# Patient Record
Sex: Male | Born: 1950 | Race: White | Hispanic: No | Marital: Married | Smoking: Former smoker
Health system: Southern US, Community
[De-identification: ages and names within clinical notes are randomized; demographics above are authoritative.]

## PROBLEM LIST (undated history)

## (undated) DIAGNOSIS — H409 Unspecified glaucoma: Secondary | ICD-10-CM

## (undated) DIAGNOSIS — Z86010 Personal history of colonic polyps: Secondary | ICD-10-CM

## (undated) DIAGNOSIS — M1990 Unspecified osteoarthritis, unspecified site: Secondary | ICD-10-CM

## (undated) DIAGNOSIS — H2513 Age-related nuclear cataract, bilateral: Secondary | ICD-10-CM

## (undated) DIAGNOSIS — E669 Obesity, unspecified: Secondary | ICD-10-CM

## (undated) DIAGNOSIS — Z860101 Personal history of adenomatous and serrated colon polyps: Secondary | ICD-10-CM

## (undated) DIAGNOSIS — I10 Essential (primary) hypertension: Secondary | ICD-10-CM

## (undated) DIAGNOSIS — H9190 Unspecified hearing loss, unspecified ear: Secondary | ICD-10-CM

## (undated) DIAGNOSIS — N200 Calculus of kidney: Secondary | ICD-10-CM

## (undated) DIAGNOSIS — IMO0001 Reserved for inherently not codable concepts without codable children: Secondary | ICD-10-CM

## (undated) DIAGNOSIS — E7800 Pure hypercholesterolemia, unspecified: Secondary | ICD-10-CM

## (undated) HISTORY — DX: Calculus of kidney: N20.0

## (undated) HISTORY — DX: Personal history of adenomatous and serrated colon polyps: Z86.0101

## (undated) HISTORY — DX: Unspecified glaucoma: H40.9

## (undated) HISTORY — DX: Obesity, unspecified: E66.9

## (undated) HISTORY — DX: Personal history of colonic polyps: Z86.010

## (undated) HISTORY — DX: Unspecified osteoarthritis, unspecified site: M19.90

## (undated) HISTORY — PX: COLONOSCOPY: SHX174

## (undated) HISTORY — DX: Age-related nuclear cataract, bilateral: H25.13

## (undated) HISTORY — DX: Essential (primary) hypertension: I10

## (undated) HISTORY — DX: Unspecified hearing loss, unspecified ear: H91.90

## (undated) HISTORY — DX: Pure hypercholesterolemia, unspecified: E78.00

## (undated) HISTORY — DX: Reserved for inherently not codable concepts without codable children: IMO0001

---

## 1997-11-15 DIAGNOSIS — I10 Essential (primary) hypertension: Secondary | ICD-10-CM

## 1997-11-15 DIAGNOSIS — E7800 Pure hypercholesterolemia, unspecified: Secondary | ICD-10-CM

## 1997-11-15 HISTORY — DX: Pure hypercholesterolemia, unspecified: E78.00

## 1997-11-15 HISTORY — DX: Essential (primary) hypertension: I10

## 2009-10-25 ENCOUNTER — Ambulatory Visit: Payer: Self-pay | Admitting: Diagnostic Radiology

## 2009-10-25 ENCOUNTER — Emergency Department (HOSPITAL_BASED_OUTPATIENT_CLINIC_OR_DEPARTMENT_OTHER): Admission: EM | Admit: 2009-10-25 | Discharge: 2009-10-25 | Payer: Self-pay | Admitting: Emergency Medicine

## 2010-08-04 IMAGING — CR DG CERVICAL SPINE COMPLETE 4+V
6 series · 6 of 6 positions shown · non-contrast
Comparison: None.

CLINICAL DATA: 58-year-old male with neck pain and stiffness.  No
known injury.

CERVICAL SPINE - COMPLETE 4+ VIEW

[w c-spine lat]
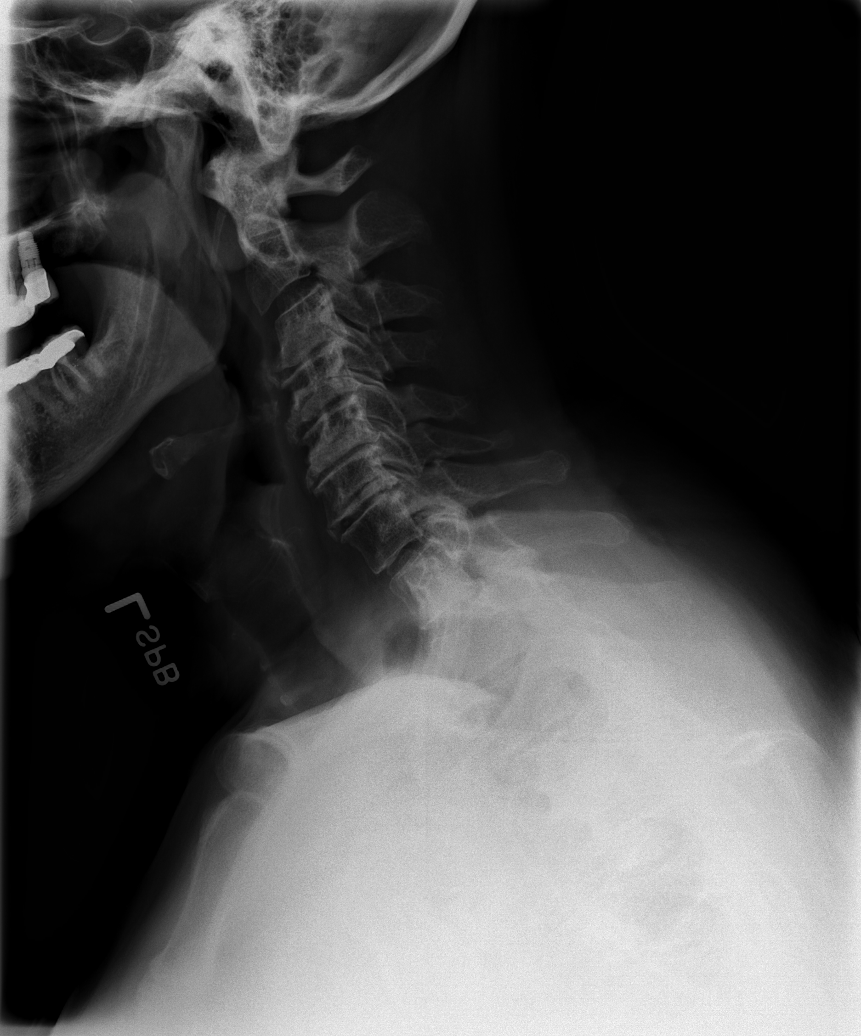

[w c-spine oblique (1 of 2)]
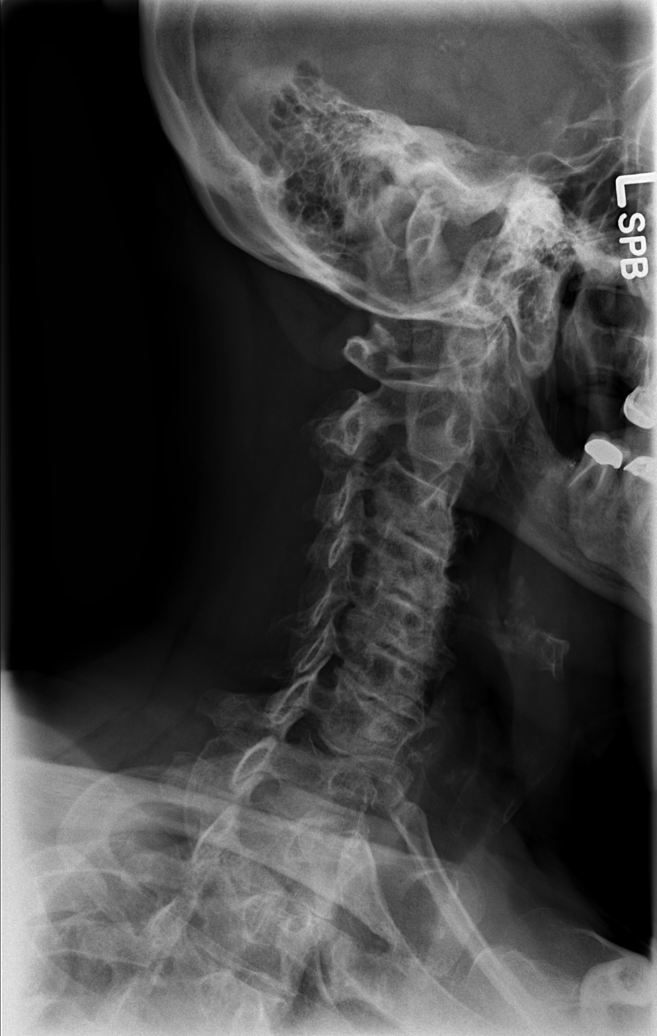

[w c-spine oblique (2 of 2)]
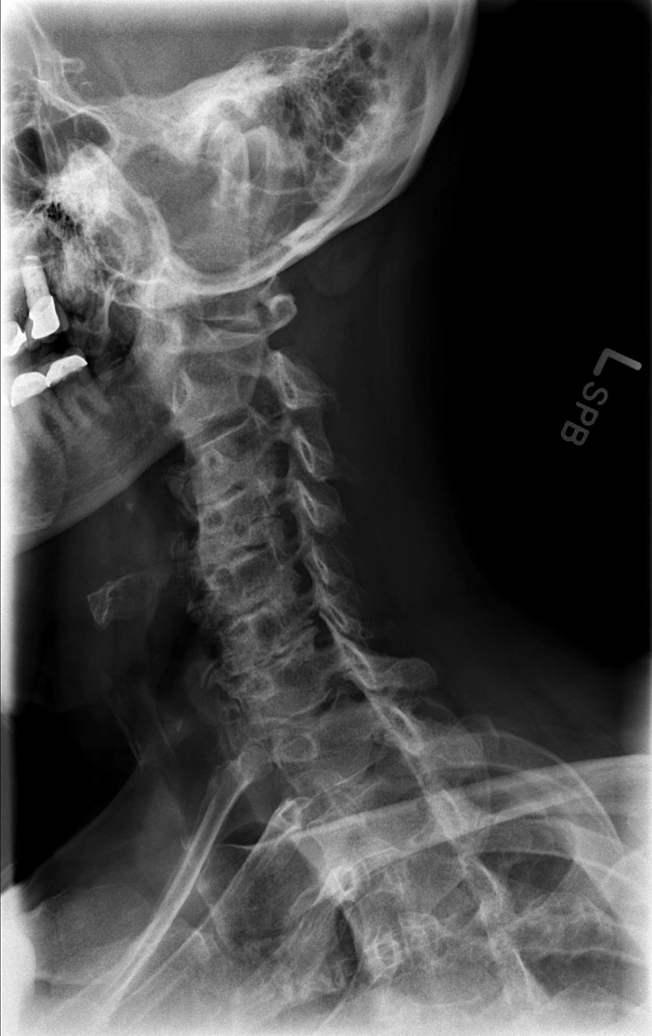

[w c-spine a.p.]
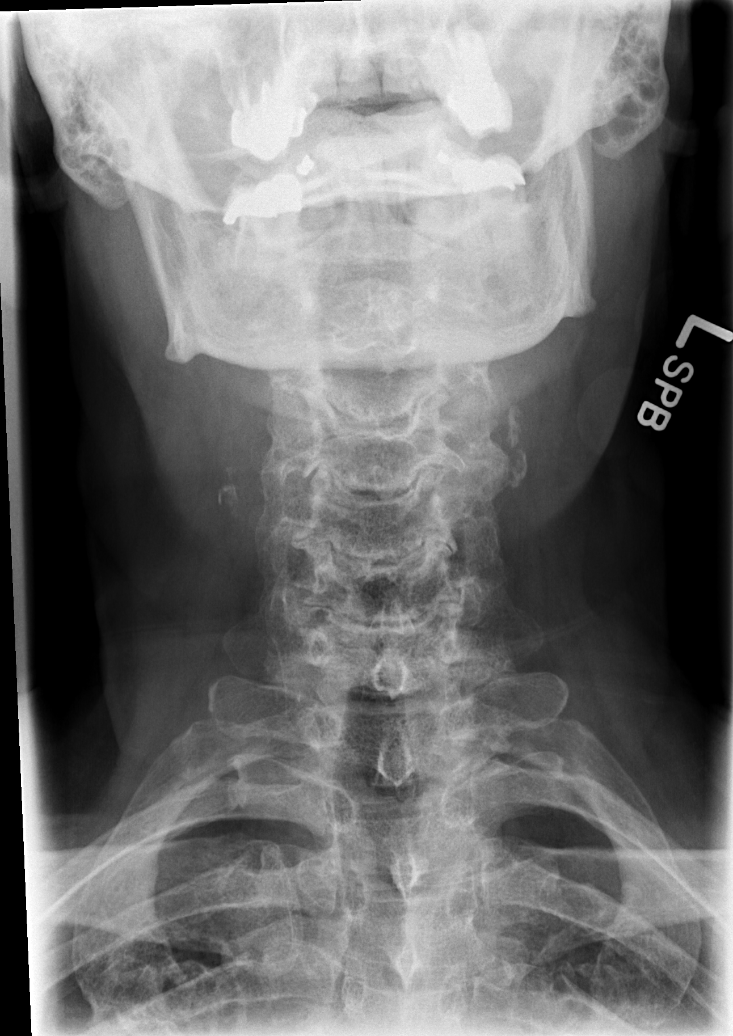

[w c-spine odontoid (1 of 2)]
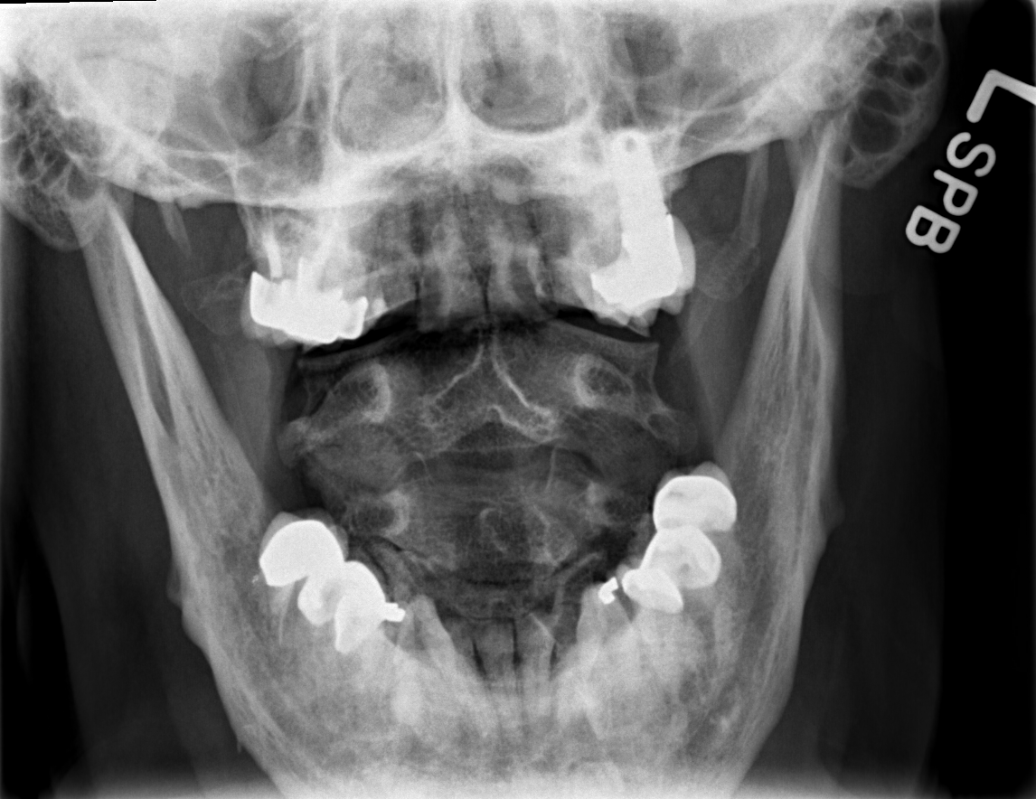

[w c-spine odontoid (2 of 2)]
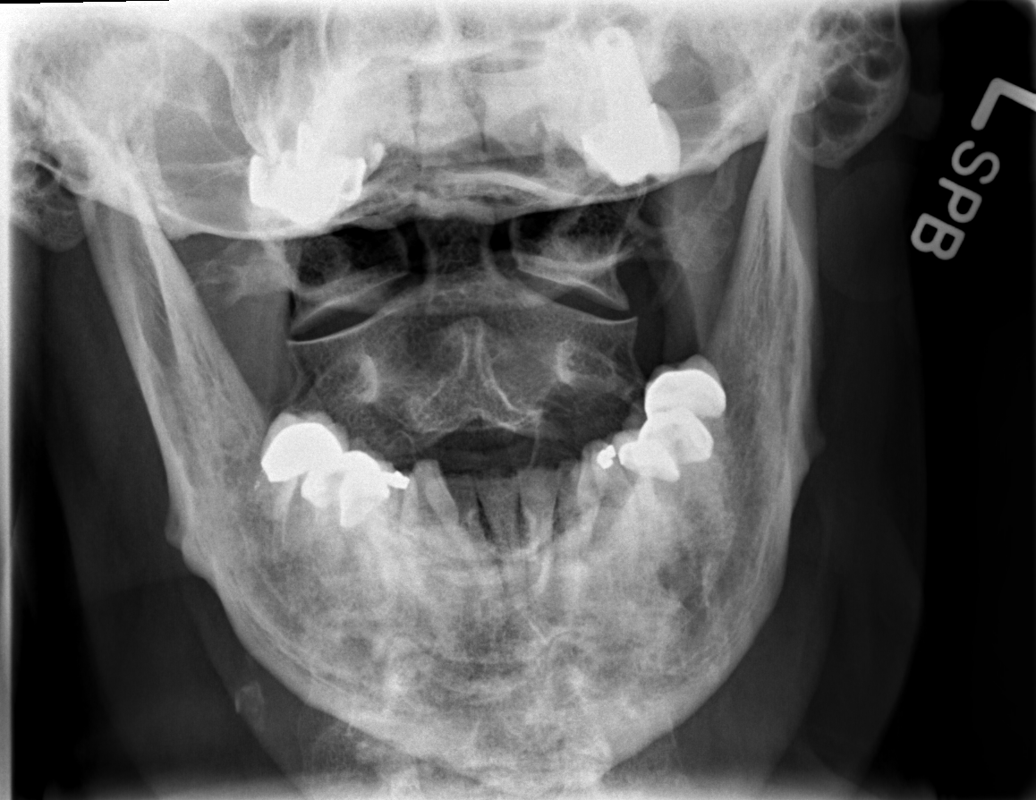

[6 of 6 positions shown; findings below may reference images not displayed]

FINDINGS: Mild reversal of cervical lordosis.  Trace
anterolisthesis of C7 on T1, and 2-3 mm of retrolisthesis of C3 on
C4.  Diffuse chronic severe cervical disc degeneration at C3-C4
through C6-C7, with endplate osteophytes and sclerosis. Bilateral
posterior element alignment is within normal limits.  Prevertebral
soft tissues are within normal limits.  Bilateral carotid calcified
atherosclerosis.  AP alignment within normal limits.  C1-C2
alignment and odontoid process within normal limits.
IMPRESSION: 1.  Chronic severe cervical disc degeneration C3-C4 through C6-C7.
2.  Spondylolisthesis at C3-C4 and to a lesser extent C7-T1.  This
is favored to be chronic and degenerative.

## 2011-04-19 ENCOUNTER — Encounter: Payer: Self-pay | Admitting: Family Medicine

## 2011-04-21 ENCOUNTER — Encounter: Payer: Self-pay | Admitting: Family Medicine

## 2011-04-21 ENCOUNTER — Ambulatory Visit (INDEPENDENT_AMBULATORY_CARE_PROVIDER_SITE_OTHER): Payer: 59 | Admitting: Family Medicine

## 2011-04-21 DIAGNOSIS — E785 Hyperlipidemia, unspecified: Secondary | ICD-10-CM | POA: Insufficient documentation

## 2011-04-21 DIAGNOSIS — Z23 Encounter for immunization: Secondary | ICD-10-CM

## 2011-04-21 DIAGNOSIS — Z125 Encounter for screening for malignant neoplasm of prostate: Secondary | ICD-10-CM

## 2011-04-21 DIAGNOSIS — E119 Type 2 diabetes mellitus without complications: Secondary | ICD-10-CM

## 2011-04-21 DIAGNOSIS — I10 Essential (primary) hypertension: Secondary | ICD-10-CM

## 2011-04-21 DIAGNOSIS — R3129 Other microscopic hematuria: Secondary | ICD-10-CM

## 2011-04-21 LAB — HEMOGLOBIN A1C: Hgb A1c MFr Bld: 6.6 % — ABNORMAL HIGH (ref 4.6–6.5)

## 2011-04-21 LAB — TSH: TSH: 0.69 u[IU]/mL (ref 0.35–5.50)

## 2011-04-21 LAB — MICROALBUMIN / CREATININE URINE RATIO
Creatinine,U: 67.3 mg/dL
Microalb Creat Ratio: 0.7 mg/g (ref 0.0–30.0)
Microalb, Ur: 0.5 mg/dL (ref 0.0–1.9)

## 2011-04-21 LAB — LIPID PANEL
Cholesterol: 111 mg/dL (ref 0–200)
HDL: 51.2 mg/dL (ref >39.00)
Triglycerides: 30 mg/dL (ref 0.0–149.0)
VLDL: 6 mg/dL (ref 0.0–40.0)

## 2011-04-21 LAB — CBC WITH DIFFERENTIAL/PLATELET
Basophils Absolute: 0 10*3/uL (ref 0.0–0.1)
Eosinophils Absolute: 0.4 10*3/uL (ref 0.0–0.7)
Lymphocytes Relative: 27.2 % (ref 12.0–46.0)
MCHC: 34 g/dL (ref 30.0–36.0)
Monocytes Relative: 10.2 % (ref 3.0–12.0)
Neutro Abs: 4.3 10*3/uL (ref 1.4–7.7)
Neutrophils Relative %: 56.7 % (ref 43.0–77.0)
Platelets: 250 10*3/uL (ref 150.0–400.0)
RDW: 14 % (ref 11.5–14.6)

## 2011-04-21 LAB — COMPREHENSIVE METABOLIC PANEL
BUN: 13 mg/dL (ref 6–23)
CO2: 28 mEq/L (ref 19–32)
GFR: 127.61 mL/min (ref >60.00)
Glucose, Bld: 117 mg/dL — ABNORMAL HIGH (ref 70–99)
Sodium: 139 mEq/L (ref 135–145)
Total Bilirubin: 0.4 mg/dL (ref 0.3–1.2)
Total Protein: 7.6 g/dL (ref 6.0–8.3)

## 2011-04-21 LAB — POCT URINALYSIS DIPSTICK
Bilirubin, UA: NEGATIVE
Glucose, UA: NEGATIVE
Nitrite, UA: NEGATIVE
Urobilinogen, UA: 0.2

## 2011-04-21 NOTE — Assessment & Plan Note (Signed)
FLP ordered today. Continue statin and low chol diet.

## 2011-04-21 NOTE — Progress Notes (Addendum)
Office Note 04/21/2011  CC:  Chief Complaint  Patient presents with  . Establish Care    new patient    HPI:  Thomas Boyle is a 60 y.o. White male who is here to establish care. Patient's most recent primary MD: Dr. Dagoberto Ligas (retired this year). Old records were reviewed prior to or during today's visit.  Has had well controlled DM 2 w/oral meds and diet treatment since 1998.  No diabetic nephropathy, neuropathy, or retinopathy. Last HbA1c was 6.3% on 08/13/10, at which time his lipids were at goal as well.   He's due for urine microalbumin testing (last 01/2010). Has also had well controlled HTN, compliant with meds and low Na diet. Does not exercise but is NOT sedentary.  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  Says he has distant history of kidney stone in the 1990s--says he passed one at that time and has not had symptoms since then, never got radiographs or saw specialist, etc.  Past Medical History  Diagnosis Date  . Hypercholesteremia 1999  . Hypertension 1999  . Degenerative joint disease     C-spine  . Hx of adenomatous colonic polyps 2006    Colonoscopy 2006+ aden.col. pol.  Rpt 05/14/2010 showed mild diverticulosis and int hem, o/w nl.  Rpt 5 yrs rec'd.  . Diabetes mellitus 1998    No neuropathy or nephropathy.  Last D.R exam (neg) 01/2001.  . Nephrolithiasis 1990s    No past surgical history on file.  Family History  Problem Relation Age of Onset  . Diabetes Mother     type 2  . Heart disease Mother     CABG  . Stroke Father     paralyzed on right side  . Diabetes Father     type 2    History   Social History  . Marital Status: Married    Spouse Name: N/A    Number of Children: N/A  . Years of Education: N/A   Occupational History  . Not on file.   Social History Main Topics  . Smoking status: Former Smoker    Types: Cigarettes    Quit date: 11/15/1977  . Smokeless tobacco: Not on  file  . Alcohol Use: Yes     6 pk a week  . Drug Use: No  . Sexually Active: Yes -- Male partner(s)   Other Topics Concern  . Not on file   Social History Narrative   Married, one grown daughter.  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  Lives in [place] in [place].    Outpatient Encounter Prescriptions as of 04/21/2011  Medication Sig Dispense Refill  . aspirin 81 MG tablet Take 81 mg by mouth daily.        Marland Kitchen atorvastatin (LIPITOR) 40 MG tablet Take 40 mg by mouth daily.        Marland Kitchen doxazosin (CARDURA) 4 MG tablet Take 4 mg by mouth at bedtime.        . metFORMIN (GLUCOPHAGE) 500 MG tablet Take 500 mg by mouth 3 (three) times daily.        . OMEGA 3 1200 MG CAPS Take 1 capsule by mouth 2 (two) times daily.        . pioglitazone (ACTOS) 45 MG tablet Take 45 mg by mouth daily.        . ramipril (ALTACE) 10 MG capsule Take 10 mg by mouth daily.        Marland Kitchen  sitaGLIPtan (JANUVIA) 100 MG tablet Take 100 mg by mouth daily.          No Known Allergies  ROS Review of Systems  Constitutional: Negative for fever, chills, appetite change and fatigue.  HENT: Negative for ear pain, congestion, sore throat, neck stiffness and dental problem.   Eyes: Negative for discharge, redness and visual disturbance.  Respiratory: Negative for cough, chest tightness, shortness of breath and wheezing.   Cardiovascular: Negative for chest pain, palpitations and leg swelling.  Gastrointestinal: Negative for nausea, vomiting, abdominal pain, diarrhea and blood in stool.  Genitourinary: Negative for dysuria, urgency, frequency, hematuria, flank pain and difficulty urinating.  Musculoskeletal: Negative for myalgias, back pain, joint swelling and arthralgias.  Skin: Negative for pallor and rash.  Neurological: Negative for dizziness, speech difficulty, weakness and headaches.  Hematological: Negative for adenopathy. Does not bruise/bleed easily.  Psychiatric/Behavioral: Negative for  confusion and sleep disturbance. The patient is not nervous/anxious.     PE; Blood pressure 144/79, pulse 57, temperature 97.9 F (36.6 C), temperature source Oral, height 5' 9.25" (1.759 m), weight 200 lb 12.8 oz (91.082 kg), SpO2 96.00%. Gen: Alert, well appearing.  Patient is oriented to person, place, time, and situation. HEENT: PERRLA, optic disc margins clear, retinal vessels unremarkable.  EOMI. Neck: supple, ROM full.  Carotids 2+ bilat, without bruit.  No lymphadenopathy, thyromegaly, or mass. Chest: symmetric expansion, nonlabored respirations.  Clear and equal breath sounds in all lung fields.   CV: RRR, no m/r/g.  Peripheral pulses 2+ and symmetric. EXT: no clubbing, cyanosis, or edema.  No sensory deficits in feet.  Pertinent labs:  none  ASSESSMENT AND PLAN:   New patient:  Type II or unspecified type diabetes mellitus without mention of complication, not stated as uncontrolled HbA1c and urine microalbumin-Cr ratio testing today. Continue current med regimen and diet. Encouraged him to begin regular physical exercise.  HTN (hypertension), benign Problem stable.  Continue current medications and diet appropriate for this condition.  We have reviewed our general long term plan for this problem and also reviewed symptoms and signs that should prompt the patient to call or return to the office. Electrolytes/Cr ordered today.  Hyperlipidemia FLP ordered today. Continue statin and low chol diet.  Need for Tdap vaccination TdaP given today.  Microhematuria Check UA today.  Proceed with w/u as appropriate.  Prostate cancer screening Last PSA was 0.3 in March of 2011. We will discuss doing DRE and routine repeat of PSA at next f/u visit in 74mo.     Return in about 62 months (around 08/21/2011).

## 2011-04-21 NOTE — Progress Notes (Signed)
Addended by: Baldemar Lenis R on: 04/21/2011 01:53 PM   Modules accepted: Orders

## 2011-04-21 NOTE — Assessment & Plan Note (Addendum)
Problem stable.  Continue current medications and diet appropriate for this condition.  We have reviewed our general long term plan for this problem and also reviewed symptoms and signs that should prompt the patient to call or return to the office. Electrolytes/Cr ordered today.

## 2011-04-21 NOTE — Assessment & Plan Note (Signed)
TdaP given today.

## 2011-04-21 NOTE — Assessment & Plan Note (Signed)
Last PSA was 0.3 in March of 2011. We will discuss doing DRE and routine repeat of PSA at next f/u visit in 17mo.

## 2011-04-21 NOTE — Assessment & Plan Note (Signed)
HbA1c and urine microalbumin-Cr ratio testing today. Continue current med regimen and diet. Encouraged him to begin regular physical exercise.

## 2011-04-21 NOTE — Assessment & Plan Note (Signed)
Check UA today.  Proceed with w/u as appropriate.

## 2011-04-23 ENCOUNTER — Telehealth: Payer: Self-pay | Admitting: Family Medicine

## 2011-04-23 LAB — URINE CULTURE: Organism ID, Bacteria: NO GROWTH

## 2011-04-23 NOTE — Telephone Encounter (Signed)
Discussed recent lab results with patient on the phone today (all good, no change in meds at this time). Next f/u is October, at which time we'll schedule CT scan of abd/pelv w and w/out contrast to further evaluate his persistent microhematuria. Also will refer to urology after CT done so they can consider cystoscopy.  This was discussed in detail with patient on the phone today.

## 2011-04-30 ENCOUNTER — Other Ambulatory Visit: Payer: Self-pay | Admitting: *Deleted

## 2011-04-30 DIAGNOSIS — E119 Type 2 diabetes mellitus without complications: Secondary | ICD-10-CM

## 2011-04-30 MED ORDER — METFORMIN HCL 500 MG PO TABS: ORAL_TABLET | ORAL | Status: DC

## 2011-04-30 NOTE — Telephone Encounter (Signed)
rx sent to pharm

## 2011-05-04 ENCOUNTER — Telehealth: Payer: Self-pay | Admitting: Family Medicine

## 2011-05-04 DIAGNOSIS — E119 Type 2 diabetes mellitus without complications: Secondary | ICD-10-CM

## 2011-05-04 MED ORDER — METFORMIN HCL 500 MG PO TABS: ORAL_TABLET | ORAL | Status: DC

## 2011-05-04 NOTE — Telephone Encounter (Signed)
Pt calling with rx problem with metformin: instructions say 1 bid: #90 was supposed to be dispensed but pharmacy adjusted this to 60 to match the sig.  The rx should say 1 qAM and 2 qpm, #90, RF x 3. I talked to his pharmacy and they said they would fix the rx, told me to ask pt to return to pharmacy.  I told pt this today and he'll return to pharmacy to get correct rx.--PM

## 2011-07-20 ENCOUNTER — Other Ambulatory Visit: Payer: Self-pay

## 2011-07-20 MED ORDER — SITAGLIPTIN PHOSPHATE 100 MG PO TABS: 100.0000 mg | ORAL_TABLET | Freq: Every day | ORAL | Status: DC

## 2011-08-18 ENCOUNTER — Encounter: Payer: Self-pay | Admitting: Family Medicine

## 2011-08-18 ENCOUNTER — Ambulatory Visit (INDEPENDENT_AMBULATORY_CARE_PROVIDER_SITE_OTHER): Payer: 59 | Admitting: Family Medicine

## 2011-08-18 VITALS — BP 116/66 | HR 76 | Temp 97.7°F | Wt 200.0 lb

## 2011-08-18 DIAGNOSIS — R3129 Other microscopic hematuria: Secondary | ICD-10-CM

## 2011-08-18 DIAGNOSIS — E119 Type 2 diabetes mellitus without complications: Secondary | ICD-10-CM

## 2011-08-18 DIAGNOSIS — Z23 Encounter for immunization: Secondary | ICD-10-CM

## 2011-08-18 DIAGNOSIS — E785 Hyperlipidemia, unspecified: Secondary | ICD-10-CM

## 2011-08-18 DIAGNOSIS — I10 Essential (primary) hypertension: Secondary | ICD-10-CM

## 2011-08-18 DIAGNOSIS — R9431 Abnormal electrocardiogram [ECG] [EKG]: Secondary | ICD-10-CM

## 2011-08-18 MED ORDER — SITAGLIPTIN PHOSPHATE 100 MG PO TABS: 100.0000 mg | ORAL_TABLET | Freq: Every day | ORAL | Status: DC

## 2011-08-18 MED ORDER — ATORVASTATIN CALCIUM 40 MG PO TABS: 40.0000 mg | ORAL_TABLET | Freq: Every day | ORAL | Status: DC

## 2011-08-18 MED ORDER — ZOSTER VACCINE LIVE 19400 UNT/0.65ML ~~LOC~~ SOLR: 0.6500 mL | Freq: Once | SUBCUTANEOUS | Status: DC

## 2011-08-18 MED ORDER — RAMIPRIL 10 MG PO CAPS: 10.0000 mg | ORAL_CAPSULE | Freq: Every day | ORAL | Status: DC

## 2011-08-18 MED ORDER — PIOGLITAZONE HCL 45 MG PO TABS: 45.0000 mg | ORAL_TABLET | Freq: Every day | ORAL | Status: DC

## 2011-08-18 MED ORDER — DOXAZOSIN MESYLATE 4 MG PO TABS: 4.0000 mg | ORAL_TABLET | Freq: Every day | ORAL | Status: DC

## 2011-08-18 NOTE — Assessment & Plan Note (Signed)
More info gathered today: pt states he had a "growth" on kidney at one point and was seeing Dr. Aldean Ast for follow up of this and for hx of a kidney stone.  He states he eventually just stopped going to routine follow ups but would be agreeable to going back to Dr. Aldean Ast for this now. We'll call pt in mid November when he is less busy and see about setting up his CT scan w and w/out contrast--abd/pelv.  Then we'll get him back with Dr. Aldean Ast.

## 2011-08-18 NOTE — Assessment & Plan Note (Signed)
Great control.  UTD on HbA1c and urine microalb/cr, but is due for diab retin screening exam (last was 02/2010)--he'll contact them for appt. Continue current meds, diet.

## 2011-08-18 NOTE — Assessment & Plan Note (Signed)
Problem stable.  Continue current medications and diet appropriate for this condition.  We have reviewed our general long term plan for this problem and also reviewed symptoms and signs that should prompt the patient to call or return to the office.  

## 2011-08-18 NOTE — Progress Notes (Signed)
OFFICE VISIT  08/18/2011   CC:  Chief Complaint  Patient presents with  . Follow-up    HTN, DM, has insurance papers     HPI:    Patient is a 60 y.o. Caucasian male who presents for 40mo f/u for DM 2, HTN, hyperlipidemia, and also to discuss persistent microhematuria. Feels good.  Compliant with meds.  Keeping busy at home and with work but no formal exercise. Does not check glucoses at home.  No home bp measurements to report.  He got an insurance exam 07/07/11 and got some blood tests, urinalysis, and an EKG.  UA showed 6 RBCs/HPF: consistent with our findings 04/2011.  We have discussed further w/u for this and he is ok with proceeding but wants to time it for later in November at the earliest. Other lab results from 07/07/11 insurance exam were reviewed today: HbA1c was 6.1%, PSA 0.28, hep B and C neg, HIV neg, chol all at goal, CMET all normal. He was told his EKG was abnormal at that visit: in review of a grainy photocopy of it today it appears to have question of subtle ST depression (<4mm) in V5 and V6, but very difficult to call.  He denies any hx of CP, SOB, palpitations, dizziness, or easy fatigue.  Past Medical History  Diagnosis Date  . Hypercholesteremia 1999  . Hypertension 1999  . Degenerative joint disease     C-spine  . Hx of adenomatous colonic polyps 2006    Colonoscopy 2006+ aden.col. pol.  Rpt 05/14/2010 showed mild diverticulosis and int hem, o/w nl.  Rpt 5 yrs rec'd.  . Diabetes mellitus 1998    No neuropathy or nephropathy.  Last D.R exam (neg) 01/2001.  . Nephrolithiasis 1990s    History reviewed. No pertinent past surgical history.  Outpatient Prescriptions Prior to Visit  Medication Sig Dispense Refill  . aspirin 81 MG tablet Take 81 mg by mouth daily.        . metFORMIN (GLUCOPHAGE) 500 MG tablet 1 tab po qAM and 2 tabs po qpm  90 tablet  3  . OMEGA 3 1200 MG CAPS Take 1 capsule by mouth 2 (two) times daily.        Marland Kitchen atorvastatin (LIPITOR) 40 MG tablet  Take 40 mg by mouth daily.        Marland Kitchen doxazosin (CARDURA) 4 MG tablet Take 4 mg by mouth at bedtime.        . pioglitazone (ACTOS) 45 MG tablet Take 45 mg by mouth daily.        . ramipril (ALTACE) 10 MG capsule Take 10 mg by mouth daily.        . sitaGLIPtin (JANUVIA) 100 MG tablet Take 1 tablet (100 mg total) by mouth daily.  30 tablet  1    No Known Allergies  ROS As per HPI  PE: Blood pressure 116/66, pulse 76, temperature 97.7 F (36.5 C), temperature source Oral, weight 200 lb (90.719 kg). Gen: Alert, well appearing.  Patient is oriented to person, place, time, and situation. No further exam today.  LABS:  12 lead EKG: sinus brady (51), normal wave morphologies, normal QRS, no ST segment abnormalities, no hypertrophy, no T wave abnormalities.  Normal EKG.  IMPRESSION AND PLAN:  Type II or unspecified type diabetes mellitus without mention of complication, not stated as uncontrolled Great control.  UTD on HbA1c and urine microalb/cr, but is due for diab retin screening exam (last was 02/2010)--he'll contact them for appt. Continue current  meds, diet.  HTN (hypertension), benign Problem stable.  Continue current medications and diet appropriate for this condition.  We have reviewed our general long term plan for this problem and also reviewed symptoms and signs that should prompt the patient to call or return to the office.   Hyperlipidemia Problem stable.  Continue current medications and diet appropriate for this condition.  We have reviewed our general long term plan for this problem and also reviewed symptoms and signs that should prompt the patient to call or return to the office.   Microhematuria More info gathered today: pt states he had a "growth" on kidney at one point and was seeing Dr. Aldean Ast for follow up of this and for hx of a kidney stone.  He states he eventually just stopped going to routine follow ups but would be agreeable to going back to Dr. Aldean Ast for  this now. We'll call pt in mid November when he is less busy and see about setting up his CT scan w and w/out contrast--abd/pelv.  Then we'll get him back with Dr. Aldean Ast.   Question of Abnl EKG at 06/2011 insurance exam: reassured pt today that I see no definite abnormality on his EKG from that date, plus today's is normal.    Zostavax rx given to patient today.  Flu vaccine IM given today.  FOLLOW UP: Return in about 4 months (around 12/19/2011) for f/u DM, HTN, hyperlipidemia.

## 2011-08-30 ENCOUNTER — Other Ambulatory Visit: Payer: Self-pay | Admitting: Family Medicine

## 2011-08-30 DIAGNOSIS — E119 Type 2 diabetes mellitus without complications: Secondary | ICD-10-CM

## 2011-08-31 MED ORDER — METFORMIN HCL 500 MG PO TABS: ORAL_TABLET | ORAL | Status: DC

## 2011-08-31 NOTE — Telephone Encounter (Signed)
Verified with Okey Regal that RX should be sent to [facility].  RX sent.  Pt has follow up on 12/22/11.

## 2011-09-15 ENCOUNTER — Other Ambulatory Visit: Payer: Self-pay | Admitting: *Deleted

## 2011-09-15 DIAGNOSIS — E119 Type 2 diabetes mellitus without complications: Secondary | ICD-10-CM

## 2011-09-15 MED ORDER — SITAGLIPTIN PHOSPHATE 100 MG PO TABS: 100.0000 mg | ORAL_TABLET | Freq: Every day | ORAL | Status: DC

## 2011-09-15 NOTE — Telephone Encounter (Signed)
Pt was given refill on 08/18/11.  [facility] not [facility].  RX resent.

## 2011-12-22 ENCOUNTER — Ambulatory Visit (INDEPENDENT_AMBULATORY_CARE_PROVIDER_SITE_OTHER): Payer: 59 | Admitting: Family Medicine

## 2011-12-22 ENCOUNTER — Encounter: Payer: Self-pay | Admitting: Family Medicine

## 2011-12-22 DIAGNOSIS — E669 Obesity, unspecified: Secondary | ICD-10-CM

## 2011-12-22 DIAGNOSIS — E66811 Obesity, class 1: Secondary | ICD-10-CM | POA: Insufficient documentation

## 2011-12-22 DIAGNOSIS — E785 Hyperlipidemia, unspecified: Secondary | ICD-10-CM

## 2011-12-22 DIAGNOSIS — R3129 Other microscopic hematuria: Secondary | ICD-10-CM

## 2011-12-22 DIAGNOSIS — I10 Essential (primary) hypertension: Secondary | ICD-10-CM

## 2011-12-22 DIAGNOSIS — E119 Type 2 diabetes mellitus without complications: Secondary | ICD-10-CM

## 2011-12-22 DIAGNOSIS — E782 Mixed hyperlipidemia: Secondary | ICD-10-CM

## 2011-12-22 HISTORY — DX: Obesity, class 1: E66.811

## 2011-12-22 HISTORY — DX: Obesity, unspecified: E66.9

## 2011-12-22 LAB — COMPREHENSIVE METABOLIC PANEL
ALT: 21 U/L (ref 0–53)
Alkaline Phosphatase: 55 U/L (ref 39–117)
CO2: 28 mEq/L (ref 19–32)
Potassium: 4.7 mEq/L (ref 3.5–5.1)
Sodium: 138 mEq/L (ref 135–145)
Total Bilirubin: 0.5 mg/dL (ref 0.3–1.2)
Total Protein: 7.5 g/dL (ref 6.0–8.3)

## 2011-12-22 LAB — LIPID PANEL
HDL: 52.4 mg/dL (ref >39.00)
Total CHOL/HDL Ratio: 2
VLDL: 7.6 mg/dL (ref 0.0–40.0)

## 2011-12-22 NOTE — Assessment & Plan Note (Signed)
He's been putting off further w/u of this. Discussed options today: we decided that he'll simply call Dr. Valda Lamb office to schedule a f/u visit with him rather than proceed with abd/pelv CT beforehand.

## 2011-12-22 NOTE — Assessment & Plan Note (Signed)
Has been at goal. Recheck fLP today.

## 2011-12-22 NOTE — Progress Notes (Signed)
OFFICE VISIT  12/22/2011   CC:  Chief Complaint  Patient presents with  . Follow-up    HTN, DM, hyperlipidemia     HPI:    Patient is a 61 y.o. Caucasian male who presents for 4 month DM 2, HTN, and hyperlipidemia follow up. Feeling fine except a bit of nagging, mild back ache last few days, L>R side--around kidney level.  No vigorous exercise or trauma.  He has not seen any gross blood in urine.  No groin or flank pain.  Doesn't monitor glucoses or bp at home.  He is compliant with all of his meds. He does his best to eat a heart-healthy/diabetic diet.  No exercise.  He still has his zostavax rx I gave back in October 2012, needs to go to pharmacy to get this vaccine administered.  Past Medical History  Diagnosis Date  . Hypercholesteremia 1999  . Hypertension 1999  . Degenerative joint disease     C-spine  . Hx of adenomatous colonic polyps 2006    Colonoscopy 2006+ aden.col. pol.  Rpt 05/14/2010 showed mild diverticulosis and int hem, o/w nl.  Rpt 5 yrs rec'd.  . Diabetes mellitus 1998    No neuropathy or nephropathy.  Last D.R exam (neg) 01/2001.  . Nephrolithiasis 1990s  . Obesity, Class I, BMI 30-34.9 12/22/2011    History reviewed. No pertinent past surgical history.  Outpatient Prescriptions Prior to Visit  Medication Sig Dispense Refill  . aspirin 81 MG tablet Take 81 mg by mouth daily.        Marland Kitchen atorvastatin (LIPITOR) 40 MG tablet Take 1 tablet (40 mg total) by mouth daily.  90 tablet  1  . doxazosin (CARDURA) 4 MG tablet Take 1 tablet (4 mg total) by mouth at bedtime.  90 tablet  1  . metFORMIN (GLUCOPHAGE) 500 MG tablet 1 tab by mouth every morning and 2 tabs by mouth every evening  90 tablet  3  . OMEGA 3 1200 MG CAPS Take 1 capsule by mouth 2 (two) times daily.        . pioglitazone (ACTOS) 45 MG tablet Take 1 tablet (45 mg total) by mouth daily.  90 tablet  1  . ramipril (ALTACE) 10 MG capsule Take 1 capsule (10 mg total) by mouth daily.  90 capsule  1  .  sitaGLIPtin (JANUVIA) 100 MG tablet Take 1 tablet (100 mg total) by mouth daily.  90 tablet  1  . zoster vaccine live, PF, (ZOSTAVAX) 16109 UNT/0.65ML injection Inject 19,400 Units into the skin once.  1 vial  0    No Known Allergies  ROS As per HPI  PE: Blood pressure 131/81, pulse 48, height 5' 9.25" (1.759 m), weight 206 lb (93.441 kg). Gen: Alert, well appearing.  Patient is oriented to person, place, time, and situation. CV: RRR, no m/r/g.   LUNGS: CTA bilat, nonlabored resps, good aeration in all lung fields. Back: no TTP.  Flanks nontender. EXT: no clubbing, cyanosis, or edema.   LABS:  None today  IMPRESSION AND PLAN:  Type II or unspecified type diabetes mellitus without mention of complication, not stated as uncontrolled Control has been good as per HbA1c's.  He doesn't monitor glucoses at home. Check A1c today. Encouraged him to call his eye MD and schedule annual diab retinpthy screening exam. He's UTD on all other DM monitoring.  HTN (hypertension), benign Problem stable.  Continue current medications and diet appropriate for this condition.  We have reviewed our general long  term plan for this problem and also reviewed symptoms and signs that should prompt the patient to call or return to the office. Lytes/cr check today.  Hyperlipidemia Has been at goal. Recheck fLP today.  Microhematuria He's been putting off further w/u of this. Discussed options today: we decided that he'll simply call Dr. Valda Lamb office to schedule a f/u visit with him rather than proceed with abd/pelv CT beforehand.     Check UA with micro today.  FOLLOW UP: Return in about 4 months (around 04/20/2012) for f/u dm, htn, hyperlip.

## 2011-12-22 NOTE — Assessment & Plan Note (Addendum)
Problem stable.  Continue current medications and diet appropriate for this condition.  We have reviewed our general long term plan for this problem and also reviewed symptoms and signs that should prompt the patient to call or return to the office. Lytes/cr check today. 

## 2011-12-22 NOTE — Progress Notes (Signed)
Addended by: Baldemar Lenis R on: 12/22/2011 09:37 AM   Modules accepted: Orders

## 2011-12-22 NOTE — Assessment & Plan Note (Signed)
Control has been good as per HbA1c's.  He doesn't monitor glucoses at home. Check A1c today. Encouraged him to call his eye MD and schedule annual diab retinpthy screening exam. He's UTD on all other DM monitoring.

## 2011-12-23 LAB — URINALYSIS, ROUTINE W REFLEX MICROSCOPIC
Glucose, UA: NEGATIVE mg/dL
Hgb urine dipstick: NEGATIVE
Ketones, ur: NEGATIVE mg/dL
pH: 6.5 (ref 5.0–8.0)

## 2011-12-24 ENCOUNTER — Telehealth: Payer: Self-pay | Admitting: Family Medicine

## 2011-12-24 NOTE — Telephone Encounter (Signed)
Patient's doctor at [facility]) has retired. Patient would like to be seen by Dr Shelby Dubin in [facility]. Can you put in a urology referral?

## 2011-12-27 ENCOUNTER — Other Ambulatory Visit: Payer: Self-pay | Admitting: Family Medicine

## 2011-12-27 DIAGNOSIS — R3129 Other microscopic hematuria: Secondary | ICD-10-CM

## 2011-12-27 NOTE — Telephone Encounter (Signed)
Thanks, patient has an appt with Dr Margo Aye 01/07/12

## 2011-12-27 NOTE — Telephone Encounter (Signed)
Order entered just now.

## 2012-01-10 ENCOUNTER — Encounter: Payer: Self-pay | Admitting: Family Medicine

## 2012-01-11 ENCOUNTER — Encounter: Payer: Self-pay | Admitting: Family Medicine

## 2012-01-21 ENCOUNTER — Other Ambulatory Visit: Payer: Self-pay | Admitting: *Deleted

## 2012-01-21 DIAGNOSIS — E119 Type 2 diabetes mellitus without complications: Secondary | ICD-10-CM

## 2012-01-21 MED ORDER — METFORMIN HCL 500 MG PO TABS: ORAL_TABLET | ORAL | Status: DC

## 2012-01-21 NOTE — Telephone Encounter (Signed)
Faxed refill request received from pharmacy.  Last seen on 12/22/11, follow up in 04/2012.  RX sent.

## 2012-03-13 ENCOUNTER — Other Ambulatory Visit: Payer: Self-pay | Admitting: *Deleted

## 2012-03-13 MED ORDER — DOXAZOSIN MESYLATE 4 MG PO TABS: 4.0000 mg | ORAL_TABLET | Freq: Every day | ORAL | Status: DC

## 2012-03-13 NOTE — Telephone Encounter (Signed)
[facility]  She states she faxed and sent escript refill request last week.  Advised I was not in the [facility] office last week so I am unaware if we received a fax, but I did not have an escribe in my In Basket.  Advised pt may have 2 refills.  Follow up in 04/2012.

## 2012-04-15 HISTORY — PX: CYSTOSCOPY W/ URETEROSCOPY W/ LITHOTRIPSY: SUR380

## 2012-04-17 ENCOUNTER — Other Ambulatory Visit: Payer: Self-pay | Admitting: *Deleted

## 2012-04-17 MED ORDER — ATORVASTATIN CALCIUM 40 MG PO TABS: 40.0000 mg | ORAL_TABLET | Freq: Every day | ORAL | Status: DC

## 2012-04-17 MED ORDER — PIOGLITAZONE HCL 45 MG PO TABS: 45.0000 mg | ORAL_TABLET | Freq: Every day | ORAL | Status: DC

## 2012-04-17 NOTE — Telephone Encounter (Signed)
Faxed refill request received from pharmacy for atorvastatin, actos Last filled by MD on 08/18/11, #90 x 1 Last seen on 02/17/12 Follow up 04/28/12 RX sent.

## 2012-04-20 ENCOUNTER — Ambulatory Visit: Payer: 59 | Admitting: Family Medicine

## 2012-04-28 ENCOUNTER — Encounter: Payer: Self-pay | Admitting: Family Medicine

## 2012-04-28 ENCOUNTER — Ambulatory Visit (INDEPENDENT_AMBULATORY_CARE_PROVIDER_SITE_OTHER): Payer: 59 | Admitting: Family Medicine

## 2012-04-28 VITALS — BP 117/71 | HR 66 | Ht 69.25 in | Wt 200.0 lb

## 2012-04-28 DIAGNOSIS — I10 Essential (primary) hypertension: Secondary | ICD-10-CM

## 2012-04-28 DIAGNOSIS — E119 Type 2 diabetes mellitus without complications: Secondary | ICD-10-CM

## 2012-04-28 DIAGNOSIS — E785 Hyperlipidemia, unspecified: Secondary | ICD-10-CM

## 2012-04-28 LAB — COMPREHENSIVE METABOLIC PANEL
ALT: 25 U/L (ref 0–53)
Albumin: 4 g/dL (ref 3.5–5.2)
CO2: 29 mEq/L (ref 19–32)
Chloride: 103 mEq/L (ref 96–112)
GFR: 113.41 mL/min (ref >60.00)
Potassium: 4.4 mEq/L (ref 3.5–5.1)
Sodium: 138 mEq/L (ref 135–145)
Total Bilirubin: 0.8 mg/dL (ref 0.3–1.2)
Total Protein: 7.5 g/dL (ref 6.0–8.3)

## 2012-04-28 LAB — HEMOGLOBIN A1C: Hgb A1c MFr Bld: 6.5 % (ref 4.6–6.5)

## 2012-04-28 MED ORDER — ZOSTER VACCINE LIVE 19400 UNT/0.65ML ~~LOC~~ SOLR: 0.6500 mL | Freq: Once | SUBCUTANEOUS | Status: DC

## 2012-04-28 NOTE — Progress Notes (Signed)
OFFICE VISIT  04/28/2012   CC:  Chief Complaint  Patient presents with  . Follow-up    multiple issues     HPI:    Patient is a 61 y.o. Caucasian male who presents for 44mo f/u DM 2, HTN, hyperlipidemia. Has appt set with urol soon for lithotripsy for 11mm stone, +stenting.   Feels well, has a bit of irritating hoarseness and mild PND lately.  Some dry cough, esp late in evenings.  NO SOB or fevers.   He doesn't check glucose at home or bps at home. Denies polyuria or polydipsia.  No CP, Sob, HA's, or dizziness.   Past Medical History  Diagnosis Date  . Hypercholesteremia 1999  . Hypertension 1999  . Degenerative joint disease     C-spine  . Hx of adenomatous colonic polyps 2006    Colonoscopy 2006+ aden.col. pol.  Rpt 05/14/2010 showed mild diverticulosis and int hem, o/w nl.  Rpt 5 yrs rec'd.  . Diabetes mellitus 1998    No neuropathy or nephropathy.  Last D.R exam (neg) 01/2001.  . Nephrolithiasis 1990s  . Obesity, Class I, BMI 30-34.9 12/22/2011    History reviewed. No pertinent past surgical history.  Outpatient Prescriptions Prior to Visit  Medication Sig Dispense Refill  . atorvastatin (LIPITOR) 40 MG tablet Take 1 tablet (40 mg total) by mouth daily.  90 tablet  1  . doxazosin (CARDURA) 4 MG tablet Take 1 tablet (4 mg total) by mouth at bedtime.  90 tablet  1  . metFORMIN (GLUCOPHAGE) 500 MG tablet 1 tab by mouth every morning and 2 tabs by mouth every evening  90 tablet  4  . pioglitazone (ACTOS) 45 MG tablet Take 1 tablet (45 mg total) by mouth daily.  90 tablet  1  . ramipril (ALTACE) 10 MG capsule Take 1 capsule (10 mg total) by mouth daily.  90 capsule  1  . sitaGLIPtin (JANUVIA) 100 MG tablet Take 1 tablet (100 mg total) by mouth daily.  90 tablet  1  . zoster vaccine live, PF, (ZOSTAVAX) 16109 UNT/0.65ML injection Inject 19,400 Units into the skin once.  1 vial  0  . aspirin 81 MG tablet Take 81 mg by mouth daily.        . OMEGA 3 1200 MG CAPS Take 1 capsule  by mouth 2 (two) times daily.          No Known Allergies  ROS As per HPI  PE: Blood pressure 117/71, pulse 66, height 5' 9.25" (1.759 m), weight 200 lb (90.719 kg). VS: noted--normal. Gen: alert, NAD, NONTOXIC APPEARING. HEENT: eyes without injection, drainage, or swelling.  Ears: EACs clear, TMs with normal light reflex and landmarks.  Nose: Mildly edematous turbinates but no injection or mucous noted.  No paranasal sinus TTP.  No facial swelling.  Throat and mouth without focal lesion.  No pharyngial swelling, erythema, or exudate.   Neck: supple, no LAD.   LUNGS: CTA bilat, nonlabored resps.   CV: RRR, no m/r/g. EXT: no c/c/e FEET: no edema or deformity.  No calluses or skin breakdown.  Sensation intact bilat in all areas with monofilament testing today.  Nails normal.  DP and PT pulses 1+ bilat.  LABS:  None today  IMPRESSION AND PLAN:  HTN (hypertension), benign Problem stable.  Continue current medications and diet appropriate for this condition.  We have reviewed our general long term plan for this problem and also reviewed symptoms and signs that should prompt the patient to  call or return to the office. Check lytes/cr today.  Hyperlipidemia Problem stable.  Continue current medications and diet appropriate for this condition.  We have reviewed our general long term plan for this problem and also reviewed symptoms and signs that should prompt the patient to call or return to the office. Lab Results  Component Value Date   CHOL 107 12/22/2011   HDL 52.40 12/22/2011   LDLCALC 47 12/22/2011   TRIG 38.0 12/22/2011   CHOLHDL 2 12/22/2011   We'll recheck lipid panel again after February 2014. Checking AST/ALT today.  Type II or unspecified type diabetes mellitus without mention of complication, not stated as uncontrolled Compliant with meds but has no home monitoring info. Full foot exam normal today. He is UTD on eyes.  Will get urine microalb/cr today and HbA1c.   Allergic  rhinitis: having a few bothersome sx's of this, so he'll try OTC nonsedating antihistamine prn as first line treatment, return if getting worse.  Nephrolithiasis: for lithotripsy and ureteral stenting soon.  Holding ASA at this time.  FOLLOW UP: Return for lab appt around lunch time today, f/u with me routine 4 mo.

## 2012-04-28 NOTE — Assessment & Plan Note (Signed)
Problem stable.  Continue current medications and diet appropriate for this condition.  We have reviewed our general long term plan for this problem and also reviewed symptoms and signs that should prompt the patient to call or return to the office. Lab Results  Component Value Date   CHOL 107 12/22/2011   HDL 52.40 12/22/2011   LDLCALC 47 12/22/2011   TRIG 38.0 12/22/2011   CHOLHDL 2 12/22/2011   We'll recheck lipid panel again after February 2014. Checking AST/ALT today.

## 2012-04-28 NOTE — Assessment & Plan Note (Signed)
Problem stable.  Continue current medications and diet appropriate for this condition.  We have reviewed our general long term plan for this problem and also reviewed symptoms and signs that should prompt the patient to call or return to the office. Check lytes/cr today.  

## 2012-04-28 NOTE — Assessment & Plan Note (Signed)
Compliant with meds but has no home monitoring info. Full foot exam normal today. He is UTD on eyes.  Will get urine microalb/cr today and HbA1c.

## 2012-05-23 ENCOUNTER — Other Ambulatory Visit: Payer: Self-pay | Admitting: *Deleted

## 2012-05-23 MED ORDER — RAMIPRIL 10 MG PO CAPS: 10.0000 mg | ORAL_CAPSULE | Freq: Every day | ORAL | Status: DC

## 2012-05-23 NOTE — Telephone Encounter (Signed)
Faxed refill request received from pharmacy for RAMIPRIL Last filled by MD on 08/18/11, #90 X 1 Last seen on 04/28/12 Follow up 09/01/12 RX sent.

## 2012-06-12 ENCOUNTER — Other Ambulatory Visit: Payer: Self-pay | Admitting: *Deleted

## 2012-06-12 DIAGNOSIS — E119 Type 2 diabetes mellitus without complications: Secondary | ICD-10-CM

## 2012-06-12 MED ORDER — METFORMIN HCL 500 MG PO TABS: ORAL_TABLET | ORAL | Status: DC

## 2012-06-12 NOTE — Telephone Encounter (Signed)
Faxed refill request received from pharmacy for METFORMIN Last filled by MD on 01/21/12, #90 X 4 Last seen on 04/28/12 Follow up 09/01/12 RX SENT.

## 2012-08-30 ENCOUNTER — Other Ambulatory Visit: Payer: Self-pay | Admitting: Family Medicine

## 2012-09-01 ENCOUNTER — Encounter: Payer: Self-pay | Admitting: Family Medicine

## 2012-09-01 ENCOUNTER — Ambulatory Visit (INDEPENDENT_AMBULATORY_CARE_PROVIDER_SITE_OTHER): Payer: 59 | Admitting: Family Medicine

## 2012-09-01 VITALS — BP 138/93 | HR 50 | Ht 69.25 in | Wt 203.0 lb

## 2012-09-01 DIAGNOSIS — Z23 Encounter for immunization: Secondary | ICD-10-CM

## 2012-09-01 DIAGNOSIS — E119 Type 2 diabetes mellitus without complications: Secondary | ICD-10-CM

## 2012-09-01 DIAGNOSIS — E785 Hyperlipidemia, unspecified: Secondary | ICD-10-CM

## 2012-09-01 DIAGNOSIS — I10 Essential (primary) hypertension: Secondary | ICD-10-CM

## 2012-09-01 LAB — HEMOGLOBIN A1C: Hgb A1c MFr Bld: 6.3 % (ref 4.6–6.5)

## 2012-09-01 NOTE — Assessment & Plan Note (Signed)
Mild elevation of diastolic today. Needs to start home monitoring so we discussed buying an automatic home bp cuff today. We reviewed his goal bp of <130/80. Continue current meds for now.

## 2012-09-01 NOTE — Progress Notes (Signed)
OFFICE NOTE  09/01/2012  CC:  Chief Complaint  Patient presents with  . Follow-up    HTN, DM, flu shot     HPI: Patient is a 61 y.o. Caucasian male who is here for 4 month f/u HTN, DM 2, and hyperlipidemia. Labs last visit were excellent--no changes in therapy were needed.   Glucose checks are irregular, says they've been normal when he does check it. Does not monitor bp at home. No acute complaints--denies burning, tingling, numbness, or pain in feet.  Pertinent PMH:  Past Medical History  Diagnosis Date  . Hypercholesteremia 1999  . Hypertension 1999  . Degenerative joint disease     C-spine  . Hx of adenomatous colonic polyps 2006    Colonoscopy 2006+ aden.col. pol.  Rpt 05/14/2010 showed mild diverticulosis and int hem, o/w nl.  Rpt 5 yrs rec'd.  . Diabetes mellitus 1998    No neuropathy or nephropathy.  Last D.R exam (neg) 01/2001.  . Nephrolithiasis 1990s  . Obesity, Class I, BMI 30-34.9 12/22/2011    MEDS:  Outpatient Prescriptions Prior to Visit  Medication Sig Dispense Refill  . aspirin 81 MG tablet Take 81 mg by mouth daily.        Marland Kitchen atorvastatin (LIPITOR) 40 MG tablet Take 1 tablet (40 mg total) by mouth daily.  90 tablet  1  . doxazosin (CARDURA) 4 MG tablet TAKE 1 TABLET BY MOUTH AT BEDTIME  90 tablet  1  . JANUVIA 100 MG tablet TAKE 1 TABLET BY MOUTH EVERY DAY  90 tablet  1  . metFORMIN (GLUCOPHAGE) 500 MG tablet 1 tab by mouth every morning and 2 tabs by mouth every evening  90 tablet  5  . OMEGA 3 1200 MG CAPS Take 1 capsule by mouth 2 (two) times daily.        . pioglitazone (ACTOS) 45 MG tablet Take 1 tablet (45 mg total) by mouth daily.  90 tablet  1  . ramipril (ALTACE) 10 MG capsule Take 1 capsule (10 mg total) by mouth daily.  90 capsule  1  . zoster vaccine live, PF, (ZOSTAVAX) 16109 UNT/0.65ML injection Inject 19,400 Units into the skin once.  1 vial  0    PE: Blood pressure 138/93, pulse 50, height 5' 9.25" (1.759 m), weight 203 lb (92.08  kg). Gen: Alert, well appearing.  Patient is oriented to person, place, time, and situation. AFFECT: pleasant, lucid thought and speech. UEA:VWUJ: no injection, icteris, swelling, or exudate.  EOMI, PERRLA. Neck: supple/nontender.  No LAD, mass, or TM.  Carotid pulses 2+ bilaterally, without bruits. CV: RRR, no m/r/g.   LUNGS: CTA bilat, nonlabored resps, good aeration in all lung fields. ABD: umbillical hernea about 3-4 cm in diameter, nontender and reduceable. EXT: no clubbing, cyanosis, or edema.    IMPRESSION AND PLAN:  Type II or unspecified type diabetes mellitus without mention of complication, not stated as uncontrolled Stable, but needs to increase home monitoring some. Check HbA1c today.  If this one is <6.5% then will space out these checks to q73mo. He's UTD on all other diabetic monitoring.  HTN (hypertension), benign Mild elevation of diastolic today. Needs to start home monitoring so we discussed buying an automatic home bp cuff today. We reviewed his goal bp of <130/80. Continue current meds for now.    Hyperlipidemia Problem stable.  Continue current medications and diet appropriate for this condition.  We have reviewed our general long term plan for this problem and also reviewed symptoms  and signs that should prompt the patient to call or return to the office.  Lab Results  Component Value Date   CHOL 107 12/22/2011   HDL 52.40 12/22/2011   LDLCALC 47 12/22/2011   TRIG 38.0 12/22/2011   CHOLHDL 2 12/22/2011   Recheck after 12/2011.    Flu vaccine IM today.  An After Visit Summary was printed and given to the patient.  FOLLOW UP: 4 mo

## 2012-09-01 NOTE — Assessment & Plan Note (Signed)
Stable, but needs to increase home monitoring some. Check HbA1c today.  If this one is <6.5% then will space out these checks to q30mo. He's UTD on all other diabetic monitoring.

## 2012-09-01 NOTE — Assessment & Plan Note (Signed)
Problem stable.  Continue current medications and diet appropriate for this condition.  We have reviewed our general long term plan for this problem and also reviewed symptoms and signs that should prompt the patient to call or return to the office.  Lab Results  Component Value Date   CHOL 107 12/22/2011   HDL 52.40 12/22/2011   LDLCALC 47 12/22/2011   TRIG 38.0 12/22/2011   CHOLHDL 2 12/22/2011   Recheck after 12/2011.

## 2012-10-17 ENCOUNTER — Other Ambulatory Visit: Payer: Self-pay | Admitting: Family Medicine

## 2012-10-17 NOTE — Telephone Encounter (Signed)
eScribe request for refill on ACTOS Last filled - 04/17/12, #90 X 1 Last seen on - 09/01/12 Follow up - 01/04/12 Refill sent per [facility] refill protocol.

## 2012-11-28 ENCOUNTER — Other Ambulatory Visit: Payer: Self-pay | Admitting: Family Medicine

## 2012-11-28 NOTE — Telephone Encounter (Signed)
eScribe request for refill on ATORVASTATIN Last filled - 04/17/12,#90 X1 Last seen on - 09/01/12 Follow up - 01/03/13 RX sent per [facility] protocol

## 2012-12-04 ENCOUNTER — Other Ambulatory Visit: Payer: Self-pay | Admitting: *Deleted

## 2012-12-04 MED ORDER — RAMIPRIL 10 MG PO CAPS: 10.0000 mg | ORAL_CAPSULE | Freq: Every day | ORAL | Status: DC

## 2012-12-04 NOTE — Telephone Encounter (Signed)
Faxed refill request received from pharmacy for RAMIPRIL Last filled by MD on 05/23/12, #90 X 1 Last seen on 09/01/12 Follow up 4 MONTHS Note on fax that insurance will only cover 30 day supply.  RX changed to 30 day supply. Refill sent per [facility] refill protocol.

## 2013-01-03 ENCOUNTER — Encounter: Payer: Self-pay | Admitting: Family Medicine

## 2013-01-03 ENCOUNTER — Ambulatory Visit (INDEPENDENT_AMBULATORY_CARE_PROVIDER_SITE_OTHER): Payer: 59 | Admitting: Family Medicine

## 2013-01-03 VITALS — BP 114/76 | HR 66 | Ht 69.25 in | Wt 204.0 lb

## 2013-01-03 DIAGNOSIS — I10 Essential (primary) hypertension: Secondary | ICD-10-CM

## 2013-01-03 DIAGNOSIS — E119 Type 2 diabetes mellitus without complications: Secondary | ICD-10-CM

## 2013-01-03 DIAGNOSIS — E785 Hyperlipidemia, unspecified: Secondary | ICD-10-CM

## 2013-01-03 LAB — COMPREHENSIVE METABOLIC PANEL
ALT: 18 U/L (ref 0–53)
AST: 24 U/L (ref 0–37)
Alkaline Phosphatase: 65 U/L (ref 39–117)
BUN: 15 mg/dL (ref 6–23)
Creatinine, Ser: 0.7 mg/dL (ref 0.4–1.5)
Potassium: 4.6 mEq/L (ref 3.5–5.1)

## 2013-01-03 LAB — HEMOGLOBIN A1C: Hgb A1c MFr Bld: 6.4 % (ref 4.6–6.5)

## 2013-01-03 NOTE — Assessment & Plan Note (Signed)
Problem stable.  Continue current medications and diet appropriate for this condition.  We have reviewed our general long term plan for this problem and also reviewed symptoms and signs that should prompt the patient to call or return to the office. Check lytes/cr today.  

## 2013-01-03 NOTE — Assessment & Plan Note (Signed)
Last check 12/2011 was excellent. AST/ALT today with metabolic panel. At next f/u in 96mo, needs fasting chol panel.

## 2013-01-03 NOTE — Assessment & Plan Note (Signed)
As usual, noncompliant with monitoring. HbA1c's have been excellent.  Will recheck A1c today.  All DM monitoring UTD except eye exam, which he is due for now. He'll call and get this scheduled.

## 2013-01-03 NOTE — Progress Notes (Signed)
OFFICE NOTE  01/03/2013  CC:  Chief Complaint  Patient presents with  . Follow-up    DM     HPI: Patient is a 62 y.o. Caucasian male who is here for routine f/u DM2, HTN, hyperlipidemia. No bp numbers or glucoses to report: not monitoring. Compliant with medications.  No exercise except physical labor at work. No acute complaints.  Pertinent PMH:  Past Medical History  Diagnosis Date  . Hypercholesteremia 1999  . Hypertension 1999  . Degenerative joint disease     C-spine  . Hx of adenomatous colonic polyps 2006    Colonoscopy 2006+ aden.col. pol.  Rpt 05/14/2010 showed mild diverticulosis and int hem, o/w nl.  Rpt 5 yrs rec'd.  . Diabetes mellitus 1998    No neuropathy or nephropathy.  Last D.R exam (neg) 01/2001.  . Nephrolithiasis 1990s  . Obesity, Class I, BMI 30-34.9 12/22/2011   Past surgical, social, and family history reviewed and no changes noted since last office visit.  MEDS:  Outpatient Prescriptions Prior to Visit  Medication Sig Dispense Refill  . aspirin 81 MG tablet Take 81 mg by mouth daily.        Marland Kitchen atorvastatin (LIPITOR) 40 MG tablet TAKE 1 TABLET (40 MG TOTAL) BY MOUTH DAILY.  90 tablet  1  . doxazosin (CARDURA) 4 MG tablet TAKE 1 TABLET BY MOUTH AT BEDTIME  90 tablet  1  . JANUVIA 100 MG tablet TAKE 1 TABLET BY MOUTH EVERY DAY  90 tablet  1  . metFORMIN (GLUCOPHAGE) 500 MG tablet 1 tab by mouth every morning and 2 tabs by mouth every evening  90 tablet  5  . OMEGA 3 1200 MG CAPS Take 1 capsule by mouth 2 (two) times daily.        . pioglitazone (ACTOS) 45 MG tablet TAKE 1 TABLET BY MOUTH EVERY DAY  90 tablet  1  . ramipril (ALTACE) 10 MG capsule Take 1 capsule (10 mg total) by mouth daily.  30 capsule  5   No facility-administered medications prior to visit.    PE: Blood pressure 114/76, pulse 66, height 5' 9.25" (1.759 m), weight 204 lb (92.534 kg). Gen: Alert, well appearing.  Patient is oriented to person, place, time, and situation. No further  exam today.  IMPRESSION AND PLAN:  Type II or unspecified type diabetes mellitus without mention of complication, not stated as uncontrolled As usual, noncompliant with monitoring. HbA1c's have been excellent.  Will recheck A1c today.  All DM monitoring UTD except eye exam, which he is due for now. He'll call and get this scheduled.   HTN (hypertension), benign Problem stable.  Continue current medications and diet appropriate for this condition.  We have reviewed our general long term plan for this problem and also reviewed symptoms and signs that should prompt the patient to call or return to the office. Check lytes/cr today.  Hyperlipidemia Last check 12/2011 was excellent. AST/ALT today with metabolic panel. At next f/u in 83mo, needs fasting chol panel.   An After Visit Summary was printed and given to the patient.  FOLLOW UP: 83mo

## 2013-02-09 ENCOUNTER — Telehealth: Payer: Self-pay | Admitting: Family Medicine

## 2013-02-09 MED ORDER — SITAGLIPTIN PHOSPHATE 100 MG PO TABS: ORAL_TABLET | ORAL | Status: DC

## 2013-02-09 NOTE — Telephone Encounter (Signed)
Patient is asking Thomas Boyle to call him on Monday 02/12/13 about his Januvia Rx. He is going to have to change pharmacies per his insurance co.

## 2013-02-09 NOTE — Telephone Encounter (Signed)
Pt confirmed Insurance requiring him to use mail order pharmacy, [facility], for benefits to pay; Rx requested sent to pharmacy; pt will verify w/local pharmacy that short supply was approved by insurance & explain Insurance requirements/SLS

## 2013-02-23 ENCOUNTER — Other Ambulatory Visit: Payer: Self-pay | Admitting: Family Medicine

## 2013-02-23 NOTE — Telephone Encounter (Signed)
eScribe request for refill on CARDURA Last filled - 08/30/12, #90 X 1 Last seen on - 01/03/13 Follow up - 07/04/13 RX sent

## 2013-03-01 ENCOUNTER — Other Ambulatory Visit: Payer: Self-pay | Admitting: *Deleted

## 2013-03-01 DIAGNOSIS — E119 Type 2 diabetes mellitus without complications: Secondary | ICD-10-CM

## 2013-03-01 MED ORDER — METFORMIN HCL 500 MG PO TABS: ORAL_TABLET | ORAL | Status: DC

## 2013-03-01 NOTE — Telephone Encounter (Signed)
Faxed refill request received from pharmacy for METFORMIN Last filled by MD on 06/12/12, #90 X 5 Last seen on 01/03/13 Follow up 6 MONTHS RX SENT

## 2013-03-12 ENCOUNTER — Telehealth: Payer: Self-pay | Admitting: *Deleted

## 2013-03-12 DIAGNOSIS — E119 Type 2 diabetes mellitus without complications: Secondary | ICD-10-CM

## 2013-03-12 MED ORDER — DOXAZOSIN MESYLATE 4 MG PO TABS: ORAL_TABLET | ORAL | Status: DC

## 2013-03-12 MED ORDER — ATORVASTATIN CALCIUM 40 MG PO TABS: ORAL_TABLET | ORAL | Status: DC

## 2013-03-12 MED ORDER — METFORMIN HCL 500 MG PO TABS: ORAL_TABLET | ORAL | Status: DC

## 2013-03-12 MED ORDER — RAMIPRIL 10 MG PO CAPS: 10.0000 mg | ORAL_CAPSULE | Freq: Every day | ORAL | Status: DC

## 2013-03-12 NOTE — Telephone Encounter (Signed)
Rx refill request from new Mail Order Pharmacy [[facility]]; Rx request to pharmacy/SLS

## 2013-03-19 ENCOUNTER — Other Ambulatory Visit: Payer: Self-pay | Admitting: *Deleted

## 2013-03-19 ENCOUNTER — Telehealth: Payer: Self-pay | Admitting: *Deleted

## 2013-03-19 DIAGNOSIS — E119 Type 2 diabetes mellitus without complications: Secondary | ICD-10-CM

## 2013-03-19 MED ORDER — PIOGLITAZONE HCL 45 MG PO TABS: ORAL_TABLET | ORAL | Status: DC

## 2013-03-19 MED ORDER — SITAGLIPTIN PHOSPHATE 100 MG PO TABS: ORAL_TABLET | ORAL | Status: DC

## 2013-03-19 NOTE — Progress Notes (Signed)
SHORT SUPPLY WHILE AWAITING MAIL ORDER per patient request "out of medication"/SLS

## 2013-03-19 NOTE — Telephone Encounter (Signed)
Rx request to pharmacy; sent to corrected pharmacy: [facility]/SLS

## 2013-04-22 ENCOUNTER — Other Ambulatory Visit: Payer: Self-pay | Admitting: Family Medicine

## 2013-04-23 ENCOUNTER — Other Ambulatory Visit: Payer: Self-pay | Admitting: Family Medicine

## 2013-04-23 NOTE — Telephone Encounter (Signed)
Denied--too soon, request already taken care of, Rx to [facility] mail order pharmacy 05.05.14 #[id number]/SLS

## 2013-04-30 ENCOUNTER — Telehealth: Payer: Self-pay | Admitting: *Deleted

## 2013-04-30 ENCOUNTER — Other Ambulatory Visit: Payer: Self-pay | Admitting: *Deleted

## 2013-04-30 MED ORDER — PIOGLITAZONE HCL 45 MG PO TABS: ORAL_TABLET | ORAL | Status: DC

## 2013-04-30 NOTE — Progress Notes (Signed)
Adjust my comment to fit [facility]/SLS

## 2013-04-30 NOTE — Telephone Encounter (Signed)
Received fax from local pharmacy [[facility]] stating pt will take last pill of Actos; Rx request to pharmacy/SLS SHORT SUPPLY awaiting mail order; Rx was sent to [facility] on 05.05.14 #[id number] E-Prescribing Status: Receipt confirmed by pharmacy (03/19/2013  9:14 AM EDT) Patient needs to contact mail order pharmacy RE: shipment.

## 2013-05-24 ENCOUNTER — Other Ambulatory Visit: Payer: Self-pay

## 2013-07-04 ENCOUNTER — Encounter: Payer: Self-pay | Admitting: Family Medicine

## 2013-07-04 ENCOUNTER — Ambulatory Visit (INDEPENDENT_AMBULATORY_CARE_PROVIDER_SITE_OTHER): Payer: 59 | Admitting: Family Medicine

## 2013-07-04 VITALS — BP 130/70 | HR 63 | Temp 98.2°F | Ht 69.25 in | Wt 193.5 lb

## 2013-07-04 DIAGNOSIS — E119 Type 2 diabetes mellitus without complications: Secondary | ICD-10-CM

## 2013-07-04 DIAGNOSIS — E785 Hyperlipidemia, unspecified: Secondary | ICD-10-CM

## 2013-07-04 DIAGNOSIS — Z125 Encounter for screening for malignant neoplasm of prostate: Secondary | ICD-10-CM

## 2013-07-04 DIAGNOSIS — Z0000 Encounter for general adult medical examination without abnormal findings: Secondary | ICD-10-CM

## 2013-07-04 DIAGNOSIS — I10 Essential (primary) hypertension: Secondary | ICD-10-CM

## 2013-07-04 DIAGNOSIS — I499 Cardiac arrhythmia, unspecified: Secondary | ICD-10-CM

## 2013-07-04 LAB — COMPREHENSIVE METABOLIC PANEL
ALT: 19 U/L (ref 0–53)
AST: 24 U/L (ref 0–37)
Albumin: 3.9 g/dL (ref 3.5–5.2)
Calcium: 9.2 mg/dL (ref 8.4–10.5)
Chloride: 102 mEq/L (ref 96–112)
Potassium: 4 mEq/L (ref 3.5–5.1)

## 2013-07-04 LAB — CBC WITH DIFFERENTIAL/PLATELET
Basophils Relative: 0.5 % (ref 0.0–3.0)
Eosinophils Absolute: 0.4 10*3/uL (ref 0.0–0.7)
MCHC: 33.6 g/dL (ref 30.0–36.0)
MCV: 92.4 fl (ref 78.0–100.0)
Monocytes Absolute: 0.8 10*3/uL (ref 0.1–1.0)
Neutro Abs: 4 10*3/uL (ref 1.4–7.7)
Neutrophils Relative %: 58.5 % (ref 43.0–77.0)
RBC: 4.22 Mil/uL (ref 4.22–5.81)
RDW: 14 % (ref 11.5–14.6)

## 2013-07-04 LAB — LIPID PANEL
Cholesterol: 112 mg/dL (ref 0–200)
LDL Cholesterol: 55 mg/dL (ref 0–99)
Total CHOL/HDL Ratio: 2

## 2013-07-04 MED ORDER — PIOGLITAZONE HCL 45 MG PO TABS: ORAL_TABLET | ORAL | Status: DC

## 2013-07-04 NOTE — Progress Notes (Signed)
Office Note 07/04/2013  CC:  Chief Complaint  Patient presents with  . Annual Exam    HPI:  Thomas Boyle is a 62 y.o. White male who is here for CPE w/fasting labs.  Denies any complaints today. 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 Of note, has been off of Actos "for a while" due to mail-order rx problems.  We are re-ordering this today. No burning, tingling, or numbness of feet. No vision c/o.  Past Medical History  Diagnosis Date  . Hypercholesteremia 1999  . Hypertension 1999  . Degenerative joint disease     C-spine  . Hx of adenomatous colonic polyps 2006; 2011    Colonoscopy 2006+ aden.col. pol.  Rpt 05/14/2010 showed mild diverticulosis and int hem, o/w nl.  Rpt 2016 rec'd.  . Diabetes mellitus 1998    No neuropathy or nephropathy.  Last D.R exam (neg) 01/2001.  . Nephrolithiasis 1990s  . Obesity, Class I, BMI 30-34.9 12/22/2011    Past Surgical History  Procedure Laterality Date  . Cystoscopy w/ ureteroscopy w/ lithotripsy  04/2012  . Colonoscopy  2006;2011    2006-adenomatous polypectomy.  2011--diverticulosis.  No polyps.  Repeat 2016.    Family History  Problem Relation Age of Onset  . Diabetes Mother     type 2  . Heart disease Mother     CABG  . Stroke Father     paralyzed on right side  . Diabetes Father     type 2    History   Social History  . Marital Status: Married    Spouse Name: N/A    Number of Children: N/A  . Years of Education: N/A   Occupational History  . Not on file.   Social History Main Topics  . Smoking status: Former Smoker    Types: Cigarettes    Quit date: 11/15/1977  . Smokeless tobacco: Never Used  . Alcohol Use: Yes     Comment: 6 pk a week  . Drug Use: No  . Sexual Activity: Yes    Partners: Female   Other Topics Concern  . Not on file   Social History Narrative   Married, one grown daughter.  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   No formal exercise.   Lives in [place] in [place].    Outpatient Prescriptions Prior to Visit  Medication Sig Dispense Refill  . aspirin 81 MG tablet Take 81 mg by mouth daily.        Marland Kitchen atorvastatin (LIPITOR) 40 MG tablet TAKE 1 TABLET (40 MG TOTAL) BY MOUTH DAILY.  90 tablet  1  . doxazosin (CARDURA) 4 MG tablet TAKE 1 TABLET BY MOUTH AT BEDTIME  90 tablet  1  . metFORMIN (GLUCOPHAGE) 500 MG tablet 1 tab by mouth every morning and 2 tabs by mouth every evening  270 tablet  1  . OMEGA 3 1200 MG CAPS Take 1 capsule by mouth 2 (two) times daily.        . ramipril (ALTACE) 10 MG capsule Take 1 capsule (10 mg total) by mouth daily.  90 capsule  1  . sitaGLIPtin (JANUVIA) 100 MG tablet TAKE 1 TABLET BY MOUTH EVERY DAY  15 tablet  0  . pioglitazone (ACTOS) 45 MG tablet TAKE 1 TABLET BY MOUTH EVERY DAY  15 tablet  0   No facility-administered medications prior to visit.    No Known Allergies  ROS Review of Systems  Constitutional: Negative for fever, chills,  appetite change and fatigue.  HENT: Negative for ear pain, congestion, sore throat, neck stiffness and dental problem.   Eyes: Negative for discharge, redness and visual disturbance.  Respiratory: Negative for cough, chest tightness, shortness of breath and wheezing.   Cardiovascular: Negative for chest pain, palpitations and leg swelling.  Gastrointestinal: Negative for nausea, vomiting, abdominal pain, diarrhea and blood in stool.  Genitourinary: Negative for dysuria, urgency, frequency, hematuria, flank pain and difficulty urinating.  Musculoskeletal: Negative for myalgias, back pain, joint swelling and arthralgias.  Skin: Negative for pallor and rash.  Neurological: Negative for dizziness, speech difficulty, weakness and headaches.  Hematological: Negative for adenopathy. Does not bruise/bleed easily.  Psychiatric/Behavioral: Negative for confusion and sleep disturbance. The patient is not nervous/anxious.      PE; Blood pressure 130/70, pulse  63, temperature 98.2 F (36.8 C), temperature source Temporal, height 5' 9.25" (1.759 m), weight 193 lb 8 oz (87.771 kg), SpO2 94.00%. Gen: Alert, well appearing.  Patient is oriented to person, place, time, and situation. AFFECT: pleasant, lucid thought and speech. ENT: Ears: EACs clear, normal epithelium.  TMs with good light reflex and landmarks bilaterally.  Eyes: no injection, icteris, swelling, or exudate.  EOMI, PERRLA. Nose: no drainage or turbinate edema/swelling.  No injection or focal lesion.  Mouth: lips without lesion/swelling.  Oral mucosa pink and moist.  Dentition intact and without obvious caries or gingival swelling.  Oropharynx without erythema, exudate, or swelling.  Neck: supple/nontender.  No LAD, mass, or TM.  Carotid pulses 2+ bilaterally, without bruits. CV: RRR with frequent ectopy vs irreg irreg rhythm, no m/r/g.   LUNGS: CTA bilat, nonlabored resps, good aeration in all lung fields. ABD: soft, NT, ND, BS normal.  No hepatospenomegaly or mass.  No bruits. EXT: no clubbing, cyanosis, or edema.  Musculoskeletal: no joint swelling, erythema, warmth, or tenderness.  ROM of all joints intact. Skin - no sores or suspicious lesions or rashes or color changes   Pertinent labs:  12 lead EKG today showed sinus brady, every PR interval was the same, and he had no dropped QRS complexes.  No ectopy noted on today's ekg.  Unchanged from EKG 2012.  ASSESSMENT AND PLAN:   Health maintenance examination Reviewed age and gender appropriate health maintenance issues (prudent diet, regular exercise, health risks of tobacco and excessive alcohol, use of seatbelts, fire alarms in home, use of sunscreen).  Also reviewed age and gender appropriate health screening as well as vaccine recommendations. Pneumovax will be offered at next f/u--nurse shortage here today at the office. Fasting labs, including lipids, CMET, CBC, HbA1c, PSA, and urine microalbumin/cr were done today.  Diab retpthy  screen exam coming up due. DRE normal today. Colonoscopy next due in year 2016.    An After Visit Summary was printed and given to the patient.  FOLLOW UP:  Return in about 6 months (around 01/04/2014) for f/u DM, HTN, hyperlip.

## 2013-07-04 NOTE — Assessment & Plan Note (Addendum)
Reviewed age and gender appropriate health maintenance issues (prudent diet, regular exercise, health risks of tobacco and excessive alcohol, use of seatbelts, fire alarms in home, use of sunscreen).  Also reviewed age and gender appropriate health screening as well as vaccine recommendations. Pneumovax will be offered at next f/u--nurse shortage here today at the office. Fasting labs, including lipids, CMET, CBC, HbA1c, PSA, and urine microalbumin/cr were done today.  Diab retpthy screen exam coming up due. DRE normal today. Colonoscopy next due in year 2016.

## 2013-08-30 ENCOUNTER — Other Ambulatory Visit: Payer: Self-pay | Admitting: Family Medicine

## 2013-09-18 ENCOUNTER — Other Ambulatory Visit: Payer: Self-pay | Admitting: Family Medicine

## 2013-09-21 ENCOUNTER — Other Ambulatory Visit: Payer: Self-pay | Admitting: Family Medicine

## 2013-09-21 MED ORDER — SITAGLIPTIN PHOSPHATE 100 MG PO TABS: ORAL_TABLET | ORAL | Status: DC

## 2013-12-14 ENCOUNTER — Telehealth: Payer: Self-pay | Admitting: Family Medicine

## 2013-12-14 MED ORDER — LINAGLIPTIN 5 MG PO TABS: 5.0000 mg | ORAL_TABLET | Freq: Every day | ORAL | Status: DC

## 2013-12-14 NOTE — Telephone Encounter (Signed)
Pls call pt and tell him his insurance has decided that they will no longer cover his Januvia.  They want him to try a very similar med called tradjenta. Although this is an inconvenient change for him, it really should make no difference in his diabetic control when compared to [place]. He should drop by our office and pick up some samples that I'll set aside (#28 tabs). He should take one tab every morning just like he has been doing with his [place].  As long as he feels no probs from the new med then he can call our office and request a 90d mail order supply of it.-thx

## 2013-12-14 NOTE — Telephone Encounter (Signed)
Patient aware and will stop by on Monday to pick up samples.

## 2014-01-02 ENCOUNTER — Ambulatory Visit: Payer: 59 | Admitting: Family Medicine

## 2014-01-08 ENCOUNTER — Ambulatory Visit: Payer: 59 | Admitting: Family Medicine

## 2014-01-18 ENCOUNTER — Encounter: Payer: Self-pay | Admitting: Family Medicine

## 2014-01-18 ENCOUNTER — Ambulatory Visit (INDEPENDENT_AMBULATORY_CARE_PROVIDER_SITE_OTHER): Payer: Medicare Other | Admitting: Family Medicine

## 2014-01-18 VITALS — BP 112/75 | HR 71 | Temp 98.2°F | Resp 18 | Ht 69.25 in | Wt 205.0 lb

## 2014-01-18 DIAGNOSIS — Z23 Encounter for immunization: Secondary | ICD-10-CM | POA: Diagnosis not present

## 2014-01-18 DIAGNOSIS — E119 Type 2 diabetes mellitus without complications: Secondary | ICD-10-CM | POA: Diagnosis not present

## 2014-01-18 DIAGNOSIS — E785 Hyperlipidemia, unspecified: Secondary | ICD-10-CM

## 2014-01-18 DIAGNOSIS — I10 Essential (primary) hypertension: Secondary | ICD-10-CM

## 2014-01-18 LAB — BASIC METABOLIC PANEL
BUN: 13 mg/dL (ref 6–23)
CHLORIDE: 100 meq/L (ref 96–112)
CO2: 23 mEq/L (ref 19–32)
Calcium: 9.3 mg/dL (ref 8.4–10.5)
Creatinine, Ser: 0.7 mg/dL (ref 0.4–1.5)
GFR: 112.79 mL/min (ref >60.00)
GLUCOSE: 87 mg/dL (ref 70–99)
POTASSIUM: 4.1 meq/L (ref 3.5–5.1)
Sodium: 134 mEq/L — ABNORMAL LOW (ref 135–145)

## 2014-01-18 LAB — HEMOGLOBIN A1C: Hgb A1c MFr Bld: 6.6 % — ABNORMAL HIGH (ref 4.6–6.5)

## 2014-01-18 MED ORDER — DOXAZOSIN MESYLATE 4 MG PO TABS: ORAL_TABLET | ORAL | Status: DC

## 2014-01-18 MED ORDER — METFORMIN HCL 500 MG PO TABS: ORAL_TABLET | ORAL | Status: DC

## 2014-01-18 MED ORDER — LINAGLIPTIN 5 MG PO TABS: 5.0000 mg | ORAL_TABLET | Freq: Every day | ORAL | Status: DC

## 2014-01-18 MED ORDER — PIOGLITAZONE HCL 45 MG PO TABS: ORAL_TABLET | ORAL | Status: DC

## 2014-01-18 MED ORDER — ATORVASTATIN CALCIUM 40 MG PO TABS: ORAL_TABLET | ORAL | Status: DC

## 2014-01-18 MED ORDER — RAMIPRIL 10 MG PO CAPS: ORAL_CAPSULE | ORAL | Status: DC

## 2014-01-18 NOTE — Assessment & Plan Note (Addendum)
The current medical regimen is effective;  continue present plan and medications. Lab Results  Component Value Date   CHOL 112 07/04/2013   HDL 50.30 07/04/2013   LDLCALC 55 07/04/2013   TRIG 35.0 07/04/2013   CHOLHDL 2 07/04/2013   Plan to repeat lipids in approx 6 mo.

## 2014-01-18 NOTE — Progress Notes (Signed)
OFFICE NOTE  01/18/2014  CC:  Chief Complaint  Patient presents with  . Follow-up     HPI: Patient is a 63 y.o. Caucasian male who is here for 6 mo f/u DM 2, HTN, hyperlipidemia. Feeling good.  TAkes meds.  No BP checks and no glucose checks. Has not had eye exam in quite a while--Dr. Gershon Crane.  Feet: no burning, tingling, or numbness.  Pertinent PMH:  Past medical, surgical, social, and family history reviewed and no changes are noted since last office visit.  MEDS:  Outpatient Prescriptions Prior to Visit  Medication Sig Dispense Refill  . aspirin 81 MG tablet Take 81 mg by mouth daily.        . OMEGA 3 1200 MG CAPS Take 1 capsule by mouth 2 (two) times daily.        Marland Kitchen atorvastatin (LIPITOR) 40 MG tablet Take 1 tablet by mouth  daily  90 tablet  1  . doxazosin (CARDURA) 4 MG tablet Take 1 tablet by mouth at  bedtime  90 tablet  1  . linagliptin (TRADJENTA) 5 MG TABS tablet Take 1 tablet (5 mg total) by mouth daily.  90 tablet  1  . metFORMIN (GLUCOPHAGE) 500 MG tablet Take 1 tablet by mouth  every morning and 2 tabs by mouth every evening  270 tablet  1  . pioglitazone (ACTOS) 45 MG tablet TAKE 1 TABLET BY MOUTH EVERY DAY  90 tablet  2  . ramipril (ALTACE) 10 MG capsule Take 1 capsule (10 mg  total) by mouth daily.  90 capsule  1   No facility-administered medications prior to visit.    PE: Blood pressure 112/75, pulse 71, temperature 98.2 F (36.8 C), temperature source Temporal, resp. rate 18, height 5' 9.25" (1.759 m), weight 205 lb (92.987 kg), SpO2 95.00%. Gen: Alert, well appearing.  Patient is oriented to person, place, time, and situation. Foot exam - bilateral normal; no swelling, tenderness or skin or vascular lesions. Color and temperature is normal. Sensation is intact. Peripheral pulses are palpable. Toenails are normal.   IMPRESSION AND PLAN:  Type II or unspecified type diabetes mellitus without mention of complication, not stated as uncontrolled The  current medical regimen is effective;  continue present plan and medications. HbA1c today. Feet exam normal today. Reminded pt of need for eye exam again. Flu and prevnar 13 IM today.  HTN (hypertension), benign The current medical regimen is effective;  continue present plan and medications. BMET today.  Hyperlipidemia The current medical regimen is effective;  continue present plan and medications. Lab Results  Component Value Date   CHOL 112 07/04/2013   HDL 50.30 07/04/2013   LDLCALC 55 07/04/2013   TRIG 35.0 07/04/2013   CHOLHDL 2 07/04/2013   Plan to repeat lipids in approx 6 mo.   An After Visit Summary was printed and given to the patient.   FOLLOW UP: 69mo for welcome to medicare visit

## 2014-01-18 NOTE — Assessment & Plan Note (Signed)
The current medical regimen is effective;  continue present plan and medications. HbA1c today. Feet exam normal today. Reminded pt of need for eye exam again. Flu and prevnar 13 IM today.

## 2014-01-18 NOTE — Progress Notes (Signed)
Pre visit review using our clinic review tool, if applicable. No additional management support is needed unless otherwise documented below in the visit note. 

## 2014-01-18 NOTE — Assessment & Plan Note (Signed)
The current medical regimen is effective;  continue present plan and medications. BMET today. 

## 2014-01-19 ENCOUNTER — Telehealth: Payer: Self-pay | Admitting: Family Medicine

## 2014-01-19 NOTE — Telephone Encounter (Signed)
Relevant patient education assigned to patient using Emmi. ° °

## 2014-01-21 ENCOUNTER — Telehealth: Payer: Self-pay | Admitting: Family Medicine

## 2014-01-21 NOTE — Telephone Encounter (Signed)
Relevant patient education assigned to patient using Emmi. ° °

## 2014-01-23 ENCOUNTER — Telehealth: Payer: Self-pay

## 2014-01-23 ENCOUNTER — Other Ambulatory Visit: Payer: Self-pay | Admitting: Family Medicine

## 2014-01-23 MED ORDER — LINAGLIPTIN 5 MG PO TABS: 5.0000 mg | ORAL_TABLET | Freq: Every day | ORAL | Status: DC

## 2014-01-23 NOTE — Telephone Encounter (Signed)
Patient called stating [facility] didn't get his tradjenta Rx.  Resent Rx for 90 day supply x 2 refills as previously Rx'ed.

## 2014-01-23 NOTE — Telephone Encounter (Signed)
Relevant patient education assigned to patient using Emmi. ° °

## 2014-02-01 ENCOUNTER — Other Ambulatory Visit: Payer: Self-pay | Admitting: Family Medicine

## 2014-07-25 ENCOUNTER — Ambulatory Visit: Payer: Medicare Other | Admitting: Family Medicine

## 2014-08-06 ENCOUNTER — Encounter: Payer: Self-pay | Admitting: Family Medicine

## 2014-08-06 ENCOUNTER — Ambulatory Visit (INDEPENDENT_AMBULATORY_CARE_PROVIDER_SITE_OTHER): Payer: Medicare Other | Admitting: Family Medicine

## 2014-08-06 VITALS — BP 118/82 | HR 66 | Temp 98.6°F | Resp 18 | Ht 69.25 in | Wt 201.0 lb

## 2014-08-06 DIAGNOSIS — E119 Type 2 diabetes mellitus without complications: Secondary | ICD-10-CM

## 2014-08-06 DIAGNOSIS — Z0000 Encounter for general adult medical examination without abnormal findings: Secondary | ICD-10-CM | POA: Diagnosis not present

## 2014-08-06 DIAGNOSIS — E785 Hyperlipidemia, unspecified: Secondary | ICD-10-CM

## 2014-08-06 DIAGNOSIS — Z23 Encounter for immunization: Secondary | ICD-10-CM

## 2014-08-06 DIAGNOSIS — I10 Essential (primary) hypertension: Secondary | ICD-10-CM | POA: Diagnosis not present

## 2014-08-06 DIAGNOSIS — Z0389 Encounter for observation for other suspected diseases and conditions ruled out: Secondary | ICD-10-CM

## 2014-08-06 NOTE — Progress Notes (Signed)
  WELCOME TO MEDICARE (IPPE) VISIT I explained that today's visit was for the purpose of health promotion and disease detection, as well as an introduction to Medicare and it's covered benefits.  I explained that no labs or other services would be performed today, but if any were determined to be necessary then appropriate orders/referrals would be arranged for these to be done at a future date.  Patient is a 63 y/o white male who is already an established pt of mine.  Pt's medical and social history were reviewed. Specifically, we reviewed PMH/PSH/Meds/FH.  Also reviewed alcohol, tobacco, and illicit drug use.  Diet and physical activity reviewed.  Sensible diet but not overly restrictive.  Currently he doesn't desire any nutritionist referral/assistance.  No current exercise regimen but he is physically active/not sedentary--still working long hours. All of this info is also found in the appropriate sections of pt's EMR.  Pt was screened with appropriate screening instrument for depression.  Current or past experiences with mood disorders was discussed.    Pt's functional ability and level of safety were reviewed. Specifically, I screened for hearing impairment and fall risk.  I assessed home safety and we discussed pt's competency with activities of daily living.  He is fully competent with respect to ADLs.  EXAM: Filed Vitals:   08/06/14 0821  BP: 118/82  Pulse: 66  Temp: 98.6 F (37 C)  Resp: 18   Body mass index is 29.47 kg/(m^2). Visual acuity screen:   Visual Acuity Screening   Right eye Left eye Both eyes  Without correction: 20/25 20/25 20/25   With correction:     Pt does not get routine eye care/exams.  VA testing today noted above. No additional physical exam required or indicated today.  End of life planning: Advanced directives and power of attorney information specific to the patient were discussed.    Education, counseling, and referrals based on the information  obtained/reviewed today: none  Education, counseling, and referral for other preventive services: Written checklist was completed and given to pt for obtaining, as appropriate, the other preventive services that are covered as separate Medicare Part B benefits. Possible services that were reviewed with pt are:  -annual wellness visit (AWV) -Bone mass measurements -Cardiovascular screening blood tests -Colorectal cancer screening -Counseling to prevent tobacco use -Diabetes screening tests -Diabetes self-management training (DSMT) -Glaucoma screening -HIV screening -Medical nutrition therapy -Prostate cancer screening -Seasonal influenza, pneumococcal, and Hep B vaccines -screening mammography -screening pap tests and pelvic exam -ultrasound screening for AAA  Of the above listed services, no referrals were made.  Patient did not have an additional complaint/problem that was discussed and evaluated today.  Patient was given opportunity to ask any additional questions regarding Medicare and covered benefits.  Patient was informed that Medicare does not provide coverage for routine physical exams.  I answered all questions to the best of my ability today.    Written plan handed to patient: 1) Make appt with Dr. Gershon Crane for eye exam/glaucoma screening.  2) Think about more concrete plans for future diet and exercise changes.  3) Review your advanced directives information and fill this out as you feel necessary.  4) Flu vaccine today.  5) Return for lab visit at your convenience (fasting).     Follow up: No Follow-up on file.

## 2014-08-06 NOTE — Patient Instructions (Signed)
1) Make appt with Dr. Gershon Crane for eye exam/glaucoma screening.  2) Think about more concrete plans for future diet and exercise changes.  3) Review your advanced directives information and fill this out as you feel necessary.  4) Flu vaccine today.  5) Return for lab visit at your convenience (fasting).

## 2014-08-06 NOTE — Progress Notes (Signed)
Pre visit review using our clinic review tool, if applicable. No additional management support is needed unless otherwise documented below in the visit note. 

## 2014-08-08 ENCOUNTER — Other Ambulatory Visit (INDEPENDENT_AMBULATORY_CARE_PROVIDER_SITE_OTHER): Payer: Medicare Other

## 2014-08-08 DIAGNOSIS — Z0389 Encounter for observation for other suspected diseases and conditions ruled out: Secondary | ICD-10-CM | POA: Diagnosis not present

## 2014-08-08 DIAGNOSIS — E785 Hyperlipidemia, unspecified: Secondary | ICD-10-CM

## 2014-08-08 DIAGNOSIS — Z125 Encounter for screening for malignant neoplasm of prostate: Secondary | ICD-10-CM

## 2014-08-08 DIAGNOSIS — E119 Type 2 diabetes mellitus without complications: Secondary | ICD-10-CM | POA: Diagnosis not present

## 2014-08-08 DIAGNOSIS — I10 Essential (primary) hypertension: Secondary | ICD-10-CM

## 2014-08-08 LAB — CBC WITH DIFFERENTIAL/PLATELET
Basophils Absolute: 0 10*3/uL (ref 0.0–0.1)
Basophils Relative: 0.2 % (ref 0.0–3.0)
Eosinophils Absolute: 0.6 10*3/uL (ref 0.0–0.7)
Eosinophils Relative: 6.8 % — ABNORMAL HIGH (ref 0.0–5.0)
HCT: 43.3 % (ref 39.0–52.0)
HEMOGLOBIN: 14.1 g/dL (ref 13.0–17.0)
LYMPHS ABS: 2.2 10*3/uL (ref 0.7–4.0)
Lymphocytes Relative: 25.7 % (ref 12.0–46.0)
MCHC: 32.6 g/dL (ref 30.0–36.0)
MCV: 95.6 fl (ref 78.0–100.0)
Monocytes Absolute: 0.9 10*3/uL (ref 0.1–1.0)
Monocytes Relative: 11.2 % (ref 3.0–12.0)
NEUTROS ABS: 4.7 10*3/uL (ref 1.4–7.7)
Neutrophils Relative %: 56.1 % (ref 43.0–77.0)
Platelets: 277 10*3/uL (ref 150.0–400.0)
RBC: 4.52 Mil/uL (ref 4.22–5.81)
RDW: 14.2 % (ref 11.5–15.5)
WBC: 8.4 10*3/uL (ref 4.0–10.5)

## 2014-08-08 LAB — LIPID PANEL
Cholesterol: 110 mg/dL (ref 0–200)
HDL: 50.9 mg/dL (ref >39.00)
LDL Cholesterol: 52 mg/dL (ref 0–99)
NonHDL: 59.1
Total CHOL/HDL Ratio: 2
Triglycerides: 35 mg/dL (ref 0.0–149.0)
VLDL: 7 mg/dL (ref 0.0–40.0)

## 2014-08-08 LAB — COMPREHENSIVE METABOLIC PANEL
ALT: 23 U/L (ref 0–53)
AST: 26 U/L (ref 0–37)
Albumin: 4.3 g/dL (ref 3.5–5.2)
Alkaline Phosphatase: 59 U/L (ref 39–117)
BUN: 19 mg/dL (ref 6–23)
CALCIUM: 9.1 mg/dL (ref 8.4–10.5)
CHLORIDE: 103 meq/L (ref 96–112)
CO2: 27 mEq/L (ref 19–32)
Creatinine, Ser: 0.8 mg/dL (ref 0.4–1.5)
GFR: 105.96 mL/min (ref >60.00)
Glucose, Bld: 111 mg/dL — ABNORMAL HIGH (ref 70–99)
POTASSIUM: 4.3 meq/L (ref 3.5–5.1)
Sodium: 136 mEq/L (ref 135–145)
Total Bilirubin: 0.5 mg/dL (ref 0.2–1.2)
Total Protein: 7.3 g/dL (ref 6.0–8.3)

## 2014-08-08 LAB — PSA, MEDICARE: PSA: 0.22 ng/ml (ref 0.10–4.00)

## 2014-08-08 LAB — TSH: TSH: 1.18 u[IU]/mL (ref 0.35–4.50)

## 2014-08-09 ENCOUNTER — Ambulatory Visit: Payer: Medicare Other

## 2014-08-09 DIAGNOSIS — Z0000 Encounter for general adult medical examination without abnormal findings: Secondary | ICD-10-CM

## 2014-08-09 LAB — HEMOGLOBIN A1C: Hgb A1c MFr Bld: 6.3 % (ref 4.6–6.5)

## 2014-08-30 ENCOUNTER — Other Ambulatory Visit: Payer: Self-pay | Admitting: Family Medicine

## 2014-11-29 ENCOUNTER — Encounter: Payer: Self-pay | Admitting: Family Medicine

## 2014-11-29 ENCOUNTER — Ambulatory Visit (INDEPENDENT_AMBULATORY_CARE_PROVIDER_SITE_OTHER): Payer: Medicare Other | Admitting: Family Medicine

## 2014-11-29 VITALS — BP 135/92 | HR 66 | Temp 97.6°F | Resp 18 | Ht 69.25 in | Wt 201.0 lb

## 2014-11-29 DIAGNOSIS — E785 Hyperlipidemia, unspecified: Secondary | ICD-10-CM

## 2014-11-29 DIAGNOSIS — E119 Type 2 diabetes mellitus without complications: Secondary | ICD-10-CM

## 2014-11-29 DIAGNOSIS — I10 Essential (primary) hypertension: Secondary | ICD-10-CM

## 2014-11-29 LAB — MICROALBUMIN / CREATININE URINE RATIO
Creatinine,U: 85.2 mg/dL
Microalb Creat Ratio: 0.7 mg/g (ref 0.0–30.0)
Microalb, Ur: 0.6 mg/dL (ref 0.0–1.9)

## 2014-11-29 NOTE — Progress Notes (Signed)
Pre visit review using our clinic review tool, if applicable. No additional management support is needed unless otherwise documented below in the visit note. 

## 2014-11-29 NOTE — Progress Notes (Signed)
OFFICE NOTE  11/29/2014  CC:  Chief Complaint  Patient presents with  . Follow-up     HPI: Patient is a 64 y.o. Caucasian male who is here for 4 mo f/u DM 2, hyperlipidemia, and HTN. Feeling well. No feet complaints. He knows he is overdue for eye exam.  Pertinent PMH:  Past medical, surgical, social, and family history reviewed and no changes are noted since last office visit.  MEDS:  Outpatient Prescriptions Prior to Visit  Medication Sig Dispense Refill  . aspirin 81 MG tablet Take 81 mg by mouth daily.      Marland Kitchen atorvastatin (LIPITOR) 40 MG tablet Take 1 tablet by mouth  daily 90 tablet 1  . doxazosin (CARDURA) 4 MG tablet Take 1 tablet by mouth at  bedtime 90 tablet 1  . linagliptin (TRADJENTA) 5 MG TABS tablet Take 1 tablet (5 mg total) by mouth daily. 90 tablet 2  . metFORMIN (GLUCOPHAGE) 500 MG tablet Take 1 tablet by mouth   every morning and 2 tabs by mouth every evening 180 tablet 1  . OMEGA 3 1200 MG CAPS Take 1 capsule by mouth 2 (two) times daily.      . pioglitazone (ACTOS) 45 MG tablet TAKE 1 TABLET BY MOUTH EVERY DAY 90 tablet 2  . ramipril (ALTACE) 10 MG capsule TAKE 1 CAPSULE ONE TIME DAILY 90 capsule 2   No facility-administered medications prior to visit.    PE: Blood pressure 135/92, pulse 66, temperature 97.6 F (36.4 C), temperature source Oral, resp. rate 18, height 5' 9.25" (1.759 m), weight 201 lb (91.173 kg), SpO2 95 %. Gen: Alert, well appearing.  Patient is oriented to person, place, time, and situation. Foot exam - bilateral normal; no swelling, tenderness or skin or vascular lesions. Color and temperature is normal. Sensation is intact. Peripheral pulses are palpable. Toenails are normal.  LABS: none today. Recent:  Lab Results  Component Value Date   HGBA1C 6.3 08/09/2014   Lab Results  Component Value Date   CHOL 110 08/08/2014   HDL 50.90 08/08/2014   LDLCALC 52 08/08/2014   TRIG 35.0 08/08/2014   CHOLHDL 2 08/08/2014     Chemistry       Component Value Date/Time   NA 136 08/08/2014 0928   K 4.3 08/08/2014 0928   CL 103 08/08/2014 0928   CO2 27 08/08/2014 0928   BUN 19 08/08/2014 0928   CREATININE 0.8 08/08/2014 0928      Component Value Date/Time   CALCIUM 9.1 08/08/2014 0928   ALKPHOS 59 08/08/2014 0928   AST 26 08/08/2014 0928   ALT 23 08/08/2014 0928   BILITOT 0.5 08/08/2014 0928        IMPRESSION AND PLAN:  1) DM 2 without complication: good control.  Urine microalb/cr today.  Foot exam normal today. Reminded pt he is overdue for eye exam.  Continue all current meds. We'll recheck A1c at next f/u in 4 mo.  2) HTN; The current medical regimen is effective;  continue present plan and medications.  3) Hyperlipidemia: lipids excellent 07/2014.  Continue statin.  Transaminases normal 07/2014.  An After Visit Summary was printed and given to the patient.  FOLLOW UP: 4 mo

## 2014-12-06 ENCOUNTER — Ambulatory Visit: Payer: Medicare Other | Admitting: Family Medicine

## 2014-12-23 ENCOUNTER — Other Ambulatory Visit: Payer: Self-pay | Admitting: Family Medicine

## 2015-01-01 ENCOUNTER — Other Ambulatory Visit: Payer: Self-pay | Admitting: Family Medicine

## 2015-03-12 ENCOUNTER — Other Ambulatory Visit: Payer: Self-pay | Admitting: Family Medicine

## 2015-03-20 ENCOUNTER — Encounter: Payer: Self-pay | Admitting: Family Medicine

## 2015-03-20 DIAGNOSIS — E119 Type 2 diabetes mellitus without complications: Secondary | ICD-10-CM | POA: Diagnosis not present

## 2015-03-20 DIAGNOSIS — H2513 Age-related nuclear cataract, bilateral: Secondary | ICD-10-CM | POA: Diagnosis not present

## 2015-03-20 LAB — HM DIABETES EYE EXAM

## 2015-03-28 ENCOUNTER — Encounter: Payer: Self-pay | Admitting: Family Medicine

## 2015-03-28 ENCOUNTER — Ambulatory Visit (INDEPENDENT_AMBULATORY_CARE_PROVIDER_SITE_OTHER): Payer: Medicare Other | Admitting: Family Medicine

## 2015-03-28 VITALS — BP 122/74 | HR 61 | Temp 98.4°F | Resp 16 | Wt 201.0 lb

## 2015-03-28 DIAGNOSIS — Z23 Encounter for immunization: Secondary | ICD-10-CM

## 2015-03-28 DIAGNOSIS — E119 Type 2 diabetes mellitus without complications: Secondary | ICD-10-CM | POA: Diagnosis not present

## 2015-03-28 LAB — HEMOGLOBIN A1C: Hgb A1c MFr Bld: 6.3 % (ref 4.6–6.5)

## 2015-03-28 NOTE — Progress Notes (Signed)
Pre visit review using our clinic review tool, if applicable. No additional management support is needed unless otherwise documented below in the visit note. 

## 2015-03-28 NOTE — Progress Notes (Signed)
OFFICE NOTE  03/28/2015  CC:  Chief Complaint  Patient presents with  . Follow-up    4 month follow up chronic Problems, Pt is fasting     HPI: Patient is a 64 y.o. Caucasian male who is here for 4 mo f/u DM 2, HTN, hyperlipidemia. Feeling well.  Recent eye exam normal.   No home bp checks or glucose checks to report. No acute complaints.  Compliant with all chronic meds.   Pertinent PMH:  Past medical, surgical, social, and family history reviewed and no changes are noted since last office visit.  MEDS:  Outpatient Prescriptions Prior to Visit  Medication Sig Dispense Refill  . aspirin 81 MG tablet Take 81 mg by mouth daily.      Marland Kitchen atorvastatin (LIPITOR) 40 MG tablet TAKE 1 TABLET EVERY DAY 90 tablet 2  . doxazosin (CARDURA) 4 MG tablet TAKE 1 TABLET AT BEDTIME 90 tablet 2  . metFORMIN (GLUCOPHAGE) 500 MG tablet TAKE 1 TABLET EVERY MORNING  AND TAKE 2 TABLETS EVERY EVENING 270 tablet 2  . OMEGA 3 1200 MG CAPS Take 1 capsule by mouth 2 (two) times daily.      . pioglitazone (ACTOS) 45 MG tablet TAKE 1 TABLET BY MOUTH EVERY DAY 90 tablet 2  . ramipril (ALTACE) 10 MG capsule TAKE 1 CAPSULE ONE TIME DAILY 90 capsule 2  . TRADJENTA 5 MG TABS tablet TAKE 1 TABLET EVERY DAY 90 tablet 2   No facility-administered medications prior to visit.    PE: Blood pressure 122/74, pulse 61, temperature 98.4 F (36.9 C), temperature source Oral, resp. rate 16, weight 201 lb (91.173 kg), SpO2 96 %. Gen: Alert, well appearing.  Patient is oriented to person, place, time, and situation. No further exam today  IMPRESSION AND PLAN:  1) DM 2, hx of great control HbA1c due today. Pneumovax 23 booster today.  2) HTN; The current medical regimen is effective;  continue present plan and medications.  3) Hyperlipidemia; The current medical regimen is effective;  continue present plan and medications.  4) Adenomatous colon polyps: need to call Dr. Liliane Channel office to clarify when he wants him  to get repeat colonoscopy.  I have it marked in my chart that he needs it this year (5 yr repeat) but pt recalls being told 10 yr repeat.  An After Visit Summary was printed and given to the patient.  FOLLOW UP: 4 mo, at which time we'll repeat CMET, FLP, and also do prostate ca screening.

## 2015-04-02 ENCOUNTER — Encounter: Payer: Self-pay | Admitting: Family Medicine

## 2015-04-21 ENCOUNTER — Encounter: Payer: Self-pay | Admitting: Family Medicine

## 2015-06-23 ENCOUNTER — Other Ambulatory Visit: Payer: Self-pay | Admitting: Family Medicine

## 2015-06-23 NOTE — Telephone Encounter (Signed)
RF request for pioglitazone LOV: 03/27/14 Next ov: None Last written: 01/18/14 #90 w/ 2RF

## 2015-06-24 DIAGNOSIS — Z1211 Encounter for screening for malignant neoplasm of colon: Secondary | ICD-10-CM | POA: Diagnosis not present

## 2015-06-24 DIAGNOSIS — K641 Second degree hemorrhoids: Secondary | ICD-10-CM | POA: Diagnosis not present

## 2015-06-24 DIAGNOSIS — K5730 Diverticulosis of large intestine without perforation or abscess without bleeding: Secondary | ICD-10-CM | POA: Diagnosis not present

## 2015-06-24 DIAGNOSIS — Z86010 Personal history of colonic polyps: Secondary | ICD-10-CM | POA: Diagnosis not present

## 2015-06-24 DIAGNOSIS — D125 Benign neoplasm of sigmoid colon: Secondary | ICD-10-CM | POA: Diagnosis not present

## 2015-06-29 ENCOUNTER — Encounter: Payer: Self-pay | Admitting: Family Medicine

## 2015-07-14 ENCOUNTER — Encounter: Payer: Self-pay | Admitting: Family Medicine

## 2015-09-18 ENCOUNTER — Other Ambulatory Visit: Payer: Self-pay | Admitting: Family Medicine

## 2015-09-19 NOTE — Telephone Encounter (Signed)
LOV: 03/28/15 NOV: None  RF request for doxazosin Last written: 12/24/14 #90 w/ 2RF  RF request for atorvastatin Last written: 12/24/14 #90 w/ 2RF  RF request for tradjenta Last written: 01/01/15 #90 w/ 2RF  RF request for ramipril Last written: 08/31/14 #90 w/ 2RF

## 2015-10-21 ENCOUNTER — Encounter: Payer: Self-pay | Admitting: Family Medicine

## 2015-10-21 ENCOUNTER — Ambulatory Visit (INDEPENDENT_AMBULATORY_CARE_PROVIDER_SITE_OTHER): Payer: Medicare Other | Admitting: Family Medicine

## 2015-10-21 VITALS — BP 151/88 | HR 64 | Temp 98.0°F | Resp 16 | Ht 69.25 in | Wt 200.0 lb

## 2015-10-21 DIAGNOSIS — Z125 Encounter for screening for malignant neoplasm of prostate: Secondary | ICD-10-CM

## 2015-10-21 DIAGNOSIS — E119 Type 2 diabetes mellitus without complications: Secondary | ICD-10-CM | POA: Diagnosis not present

## 2015-10-21 DIAGNOSIS — E785 Hyperlipidemia, unspecified: Secondary | ICD-10-CM | POA: Diagnosis not present

## 2015-10-21 DIAGNOSIS — Z23 Encounter for immunization: Secondary | ICD-10-CM

## 2015-10-21 DIAGNOSIS — I10 Essential (primary) hypertension: Secondary | ICD-10-CM | POA: Diagnosis not present

## 2015-10-21 LAB — LIPID PANEL
Cholesterol: 108 mg/dL (ref 0–200)
HDL: 52.6 mg/dL (ref >39.00)
LDL Cholesterol: 47 mg/dL (ref 0–99)
NONHDL: 55.46
Total CHOL/HDL Ratio: 2
Triglycerides: 43 mg/dL (ref 0.0–149.0)
VLDL: 8.6 mg/dL (ref 0.0–40.0)

## 2015-10-21 LAB — COMPREHENSIVE METABOLIC PANEL
ALBUMIN: 4.1 g/dL (ref 3.5–5.2)
ALT: 14 U/L (ref 0–53)
AST: 18 U/L (ref 0–37)
Alkaline Phosphatase: 82 U/L (ref 39–117)
BILIRUBIN TOTAL: 0.4 mg/dL (ref 0.2–1.2)
BUN: 9 mg/dL (ref 6–23)
CALCIUM: 9.3 mg/dL (ref 8.4–10.5)
CO2: 30 mEq/L (ref 19–32)
CREATININE: 0.69 mg/dL (ref 0.40–1.50)
Chloride: 101 mEq/L (ref 96–112)
GFR: 121.62 mL/min (ref >60.00)
Glucose, Bld: 108 mg/dL — ABNORMAL HIGH (ref 70–99)
Potassium: 4.3 mEq/L (ref 3.5–5.1)
SODIUM: 139 meq/L (ref 135–145)
Total Protein: 7.6 g/dL (ref 6.0–8.3)

## 2015-10-21 LAB — PSA, MEDICARE: PSA: 0.27 ng/mL (ref 0.10–4.00)

## 2015-10-21 LAB — HEMOGLOBIN A1C: Hgb A1c MFr Bld: 6.5 % (ref 4.6–6.5)

## 2015-10-21 NOTE — Progress Notes (Signed)
OFFICE NOTE  10/21/2015  CC:  Chief Complaint  Patient presents with  . Follow-up    Pt is fasting.    HPI: Patient is a 64 y.o. Caucasian male who is here for 6 mo f/u DM 2, HTN, HLD. Plan to do DRE/PSA for prostate ca screening today as well.  No home bp or glucose monitoring to report. Feeling well except some recent URI sxs.  Pertinent PMH:  Past medical, surgical, social, and family history reviewed and no changes are noted since last office visit.  MEDS:  Outpatient Prescriptions Prior to Visit  Medication Sig Dispense Refill  . aspirin 81 MG tablet Take 81 mg by mouth daily.      Marland Kitchen atorvastatin (LIPITOR) 40 MG tablet TAKE 1 TABLET EVERY DAY 90 tablet 3  . doxazosin (CARDURA) 4 MG tablet TAKE 1 TABLET AT BEDTIME 90 tablet 1  . metFORMIN (GLUCOPHAGE) 500 MG tablet TAKE 1 TABLET EVERY MORNING  AND TAKE 2 TABLETS EVERY EVENING 270 tablet 2  . OMEGA 3 1200 MG CAPS Take 1 capsule by mouth 2 (two) times daily.      . pioglitazone (ACTOS) 45 MG tablet TAKE 1 TABLET EVERY DAY 90 tablet 1  . ramipril (ALTACE) 10 MG capsule TAKE 1 CAPSULE ONE TIME DAILY 90 capsule 3  . TRADJENTA 5 MG TABS tablet TAKE 1 TABLET EVERY DAY 90 tablet 1   No facility-administered medications prior to visit.    PE: Blood pressure 151/88, pulse 64, temperature 98 F (36.7 C), temperature source Oral, resp. rate 16, height 5' 9.25" (1.759 m), weight 200 lb (90.719 kg), SpO2 96 %. Gen: Alert, well appearing.  Patient is oriented to person, place, time, and situation. CV: RRR, no m/r/g.   LUNGS: CTA bilat, nonlabored resps, good aeration in all lung fields. Rectal exam: negative without mass, lesions or tenderness, PROSTATE EXAM: smooth and symmetric without nodules or tenderness.   LABS: Lab Results  Component Value Date   HGBA1C 6.3 03/28/2015   Lab Results  Component Value Date   TSH 1.18 08/08/2014   Lab Results  Component Value Date   WBC 8.4 08/08/2014   HGB 14.1 08/08/2014   HCT 43.3  08/08/2014   MCV 95.6 08/08/2014   PLT 277.0 08/08/2014   Lab Results  Component Value Date   CREATININE 0.8 08/08/2014   BUN 19 08/08/2014   NA 136 08/08/2014   K 4.3 08/08/2014   CL 103 08/08/2014   CO2 27 08/08/2014   Lab Results  Component Value Date   ALT 23 08/08/2014   AST 26 08/08/2014   ALKPHOS 59 08/08/2014   BILITOT 0.5 08/08/2014   Lab Results  Component Value Date   CHOL 110 08/08/2014   Lab Results  Component Value Date   HDL 50.90 08/08/2014   Lab Results  Component Value Date   LDLCALC 52 08/08/2014   Lab Results  Component Value Date   TRIG 35.0 08/08/2014   Lab Results  Component Value Date   CHOLHDL 2 08/08/2014   Lab Results  Component Value Date   PSA 0.22 08/08/2014   PSA 0.26 07/04/2013    IMPRESSION AND PLAN:  1) DM 2.  Historically well controlled on current regimen. HbA1c today.  2) HTN; The current medical regimen is effective;  continue present plan and medications. Lytes/cr today.  3) HLD: tolerating statin.  FLP today as well as AST/ALT.  4) Prostate ca screening: DRE normal today.  PSA drawn today.  Flu  vaccine given today.  FOLLOW UP: 16mo f/u chronic illness

## 2015-10-21 NOTE — Progress Notes (Signed)
Pre visit review using our clinic review tool, if applicable. No additional management support is needed unless otherwise documented below in the visit note. 

## 2015-10-27 ENCOUNTER — Encounter: Payer: Self-pay | Admitting: Family Medicine

## 2015-12-28 ENCOUNTER — Other Ambulatory Visit: Payer: Self-pay | Admitting: Family Medicine

## 2015-12-29 NOTE — Telephone Encounter (Signed)
LOV: 10/21/15 NOV: 04/20/16  RF request for metformin Last written: 03/12/15 #270 w/ 2RF  RF request for pioglitazone Last written: 06/23/15 #90 w/ 1RF

## 2016-04-14 ENCOUNTER — Encounter: Payer: Self-pay | Admitting: Family Medicine

## 2016-04-15 ENCOUNTER — Ambulatory Visit (INDEPENDENT_AMBULATORY_CARE_PROVIDER_SITE_OTHER): Payer: Medicare Other | Admitting: Family Medicine

## 2016-04-15 ENCOUNTER — Encounter: Payer: Self-pay | Admitting: Family Medicine

## 2016-04-15 VITALS — BP 129/78 | HR 68 | Temp 97.6°F | Resp 16 | Ht 69.25 in | Wt 197.5 lb

## 2016-04-15 DIAGNOSIS — I10 Essential (primary) hypertension: Secondary | ICD-10-CM | POA: Diagnosis not present

## 2016-04-15 DIAGNOSIS — E119 Type 2 diabetes mellitus without complications: Secondary | ICD-10-CM

## 2016-04-15 LAB — BASIC METABOLIC PANEL
BUN: 14 mg/dL (ref 6–23)
CO2: 30 mEq/L (ref 19–32)
CREATININE: 0.8 mg/dL (ref 0.40–1.50)
Calcium: 9.9 mg/dL (ref 8.4–10.5)
Chloride: 97 mEq/L (ref 96–112)
GFR: 102.38 mL/min (ref >60.00)
GLUCOSE: 110 mg/dL — AB (ref 70–99)
Potassium: 4.8 mEq/L (ref 3.5–5.1)
SODIUM: 133 meq/L — AB (ref 135–145)

## 2016-04-15 LAB — HEMOGLOBIN A1C: HEMOGLOBIN A1C: 6.3 % (ref 4.6–6.5)

## 2016-04-15 MED ORDER — DOXAZOSIN MESYLATE 4 MG PO TABS: 4.0000 mg | ORAL_TABLET | Freq: Every day | ORAL | Status: DC

## 2016-04-15 MED ORDER — LINAGLIPTIN 5 MG PO TABS: 5.0000 mg | ORAL_TABLET | Freq: Every day | ORAL | Status: DC

## 2016-04-15 NOTE — Progress Notes (Signed)
OFFICE VISIT  04/15/2016   CC:  Chief Complaint  Patient presents with  . Follow-up    Pt is fasting.      HPI:    Patient is a 65 y.o. Caucasian male who presents for 6 mo f/u DM 2, HTN, HLD. Feeling well.  No home glucose or bp monitoring but he is compliant with diet and meds. Lots less stress in his life now that he has retired.  No burning, tingling, or numbness in feet.  ROS: no HAs, dizziness, vision complaints, CP, SOB, or LE swelling.  Past Medical History  Diagnosis Date  . Hypercholesteremia 1999  . Hypertension 1999  . Degenerative joint disease     C-spine  . Hx of adenomatous colonic polyps 2006; 2011;2016    Colonoscopy 2006+ aden.col. pol.  Rpt 05/14/2010 showed mild diverticulosis and int hem, o/w nl.  Rpt 2016 : adenomatous colon polyp x 1.  . Diabetes mellitus 1998    No neuropathy or nephropathy.  Last D.R exam (neg) 03/20/15.  . Nephrolithiasis 1990s  . Obesity, Class I, BMI 30-34.9 12/22/2011    Past Surgical History  Procedure Laterality Date  . Cystoscopy w/ ureteroscopy w/ lithotripsy  04/2012  . Colonoscopy  2006;6/302011;06/2015    2006-adenomatous polypectomy.  2011--diverticulosis.  No polyps.  2016 adenomatous polyp x 1.    Outpatient Prescriptions Prior to Visit  Medication Sig Dispense Refill  . aspirin 81 MG tablet Take 81 mg by mouth daily.      Marland Kitchen atorvastatin (LIPITOR) 40 MG tablet TAKE 1 TABLET EVERY DAY 90 tablet 3  . metFORMIN (GLUCOPHAGE) 500 MG tablet TAKE 1 TABLET EVERY MORNING  AND TAKE 2 TABLETS EVERY EVENING 270 tablet 1  . OMEGA 3 1200 MG CAPS Take 1 capsule by mouth 2 (two) times daily.      . pioglitazone (ACTOS) 45 MG tablet TAKE 1 TABLET EVERY DAY 90 tablet 1  . ramipril (ALTACE) 10 MG capsule TAKE 1 CAPSULE ONE TIME DAILY 90 capsule 3  . doxazosin (CARDURA) 4 MG tablet TAKE 1 TABLET AT BEDTIME 90 tablet 1  . TRADJENTA 5 MG TABS tablet TAKE 1 TABLET EVERY DAY 90 tablet 1   No facility-administered medications prior to  visit.    No Known Allergies  ROS As per HPI  PE: Blood pressure 129/78, pulse 68, temperature 97.6 F (36.4 C), temperature source Oral, resp. rate 16, height 5' 9.25" (1.759 m), weight 197 lb 8 oz (89.585 kg), SpO2 95 %. Gen: Alert, well appearing.  Patient is oriented to person, place, time, and situation. AFFECT: pleasant, lucid thought and speech. CV: RRR, no m/r/g.   LUNGS: CTA bilat, nonlabored resps, good aeration in all lung fields. EXT: no clubbing, cyanosis, or edema.  Foot exam - bilateral normal; no swelling, tenderness or skin or vascular lesions. Color and temperature is normal. Sensation is intact. Peripheral pulses are palpable. Toenails are normal.   LABS:  Lab Results  Component Value Date   TSH 1.18 08/08/2014   Lab Results  Component Value Date   WBC 8.4 08/08/2014   HGB 14.1 08/08/2014   HCT 43.3 08/08/2014   MCV 95.6 08/08/2014   PLT 277.0 08/08/2014   Lab Results  Component Value Date   CREATININE 0.69 10/21/2015   BUN 9 10/21/2015   NA 139 10/21/2015   K 4.3 10/21/2015   CL 101 10/21/2015   CO2 30 10/21/2015   Lab Results  Component Value Date   ALT 14 10/21/2015  AST 18 10/21/2015   ALKPHOS 82 10/21/2015   BILITOT 0.4 10/21/2015   Lab Results  Component Value Date   CHOL 108 10/21/2015   Lab Results  Component Value Date   HDL 52.60 10/21/2015   Lab Results  Component Value Date   LDLCALC 47 10/21/2015   Lab Results  Component Value Date   TRIG 43.0 10/21/2015   Lab Results  Component Value Date   CHOLHDL 2 10/21/2015   Lab Results  Component Value Date   PSA 0.27 10/21/2015   PSA 0.22 08/08/2014   PSA 0.26 07/04/2013   Lab Results  Component Value Date   HGBA1C 6.5 10/21/2015   IMPRESSION AND PLAN:  1) DM 2, good control historically.  No home monitoring. Foot exam normal today. HbA1c today.  2) HTN: The current medical regimen is effective;  continue present plan and medications. Lytes/cr today.  3)  Hyperlipidemia: The current medical regimen is effective;  continue present plan and medications. Tolerating statin.  FLP good 6 mo ago, as were his AST/ALT.  An After Visit Summary was printed and given to the patient.  FOLLOW UP: Return in about 6 months (around 10/15/2016) for annual CPE (fasting).  Signed:  Crissie Sickles, MD           04/15/2016

## 2016-04-15 NOTE — Progress Notes (Signed)
Pre visit review using our clinic review tool, if applicable. No additional management support is needed unless otherwise documented below in the visit note. 

## 2016-04-20 ENCOUNTER — Ambulatory Visit: Payer: Medicare Other | Admitting: Family Medicine

## 2016-04-29 ENCOUNTER — Ambulatory Visit: Payer: Medicare Other | Admitting: Family Medicine

## 2016-06-29 DIAGNOSIS — Z0000 Encounter for general adult medical examination without abnormal findings: Secondary | ICD-10-CM | POA: Diagnosis not present

## 2016-07-22 ENCOUNTER — Encounter: Payer: Self-pay | Admitting: Family Medicine

## 2016-07-22 ENCOUNTER — Other Ambulatory Visit: Payer: Self-pay | Admitting: Family Medicine

## 2016-07-29 ENCOUNTER — Telehealth: Payer: Self-pay

## 2016-07-29 NOTE — Telephone Encounter (Signed)
Spoke with patient regarding AWV, completed on 06/29/16 through [employer]. Copy in Media.

## 2016-10-05 ENCOUNTER — Other Ambulatory Visit: Payer: Self-pay | Admitting: Family Medicine

## 2016-10-05 MED ORDER — PIOGLITAZONE HCL 45 MG PO TABS: 45.0000 mg | ORAL_TABLET | Freq: Every day | ORAL | 0 refills | Status: DC

## 2016-10-05 NOTE — Telephone Encounter (Signed)
Patient went out of town and forgot his generic actos.  Rx # [id number] sent into [facility] in [place], [place].

## 2016-10-15 ENCOUNTER — Encounter: Payer: Self-pay | Admitting: Family Medicine

## 2016-10-19 ENCOUNTER — Ambulatory Visit (INDEPENDENT_AMBULATORY_CARE_PROVIDER_SITE_OTHER): Payer: Medicare Other | Admitting: Family Medicine

## 2016-10-19 ENCOUNTER — Encounter: Payer: Self-pay | Admitting: Family Medicine

## 2016-10-19 VITALS — BP 145/82 | HR 53 | Temp 97.2°F | Resp 16 | Ht 69.25 in | Wt 197.8 lb

## 2016-10-19 DIAGNOSIS — I10 Essential (primary) hypertension: Secondary | ICD-10-CM

## 2016-10-19 DIAGNOSIS — Z23 Encounter for immunization: Secondary | ICD-10-CM

## 2016-10-19 DIAGNOSIS — E7800 Pure hypercholesterolemia, unspecified: Secondary | ICD-10-CM | POA: Diagnosis not present

## 2016-10-19 DIAGNOSIS — E119 Type 2 diabetes mellitus without complications: Secondary | ICD-10-CM

## 2016-10-19 DIAGNOSIS — Z125 Encounter for screening for malignant neoplasm of prostate: Secondary | ICD-10-CM

## 2016-10-19 LAB — LIPID PANEL
CHOLESTEROL: 118 mg/dL (ref 0–200)
HDL: 54.3 mg/dL (ref >39.00)
LDL Cholesterol: 53 mg/dL (ref 0–99)
NonHDL: 63.94
Total CHOL/HDL Ratio: 2
Triglycerides: 53 mg/dL (ref 0.0–149.0)
VLDL: 10.6 mg/dL (ref 0.0–40.0)

## 2016-10-19 LAB — COMPREHENSIVE METABOLIC PANEL
ALBUMIN: 4.4 g/dL (ref 3.5–5.2)
ALK PHOS: 67 U/L (ref 39–117)
ALT: 17 U/L (ref 0–53)
AST: 19 U/L (ref 0–37)
BUN: 12 mg/dL (ref 6–23)
CO2: 27 mEq/L (ref 19–32)
CREATININE: 0.69 mg/dL (ref 0.40–1.50)
Calcium: 9.4 mg/dL (ref 8.4–10.5)
Chloride: 102 mEq/L (ref 96–112)
GFR: 121.25 mL/min (ref >60.00)
Glucose, Bld: 111 mg/dL — ABNORMAL HIGH (ref 70–99)
POTASSIUM: 4.2 meq/L (ref 3.5–5.1)
SODIUM: 138 meq/L (ref 135–145)
TOTAL PROTEIN: 7.6 g/dL (ref 6.0–8.3)
Total Bilirubin: 0.8 mg/dL (ref 0.2–1.2)

## 2016-10-19 LAB — HEMOGLOBIN A1C: HEMOGLOBIN A1C: 6.3 % (ref 4.6–6.5)

## 2016-10-19 LAB — PSA, MEDICARE: PSA: 0.44 ng/mL (ref 0.10–4.00)

## 2016-10-19 NOTE — Addendum Note (Signed)
Addended by: Onalee Hua on: 10/19/2016 10:44 AM   Modules accepted: Orders

## 2016-10-19 NOTE — Progress Notes (Signed)
Pre visit review using our clinic review tool, if applicable. No additional management support is needed unless otherwise documented below in the visit note. 

## 2016-10-19 NOTE — Progress Notes (Signed)
Office Note 10/19/2016  CC:  Chief Complaint  Patient presents with  . Follow-up    Pt is fasting.     HPI:  Thomas Boyle is a 65 y.o. White male who is here for f/u DM 2, HTN, HLD. Traveling a lot lately. He is not monitoring his glucose at home.   No home bp monitoring. Takes lipitor every day w/out problem.  He is active, does lots of yard work.  He is ok with doing annual prostate cancer screening today, DRE and PSA.   Past Medical History:  Diagnosis Date  . Degenerative joint disease    C-spine  . Diabetes mellitus 1998   No neuropathy or nephropathy.  Last D.R exam (neg) 03/20/15.  Marland Kitchen Hearing impairment   . Hx of adenomatous colonic polyps 2006; 2011;2016   Colonoscopy 2006+ aden.col. pol.  Rpt 05/14/2010 showed mild diverticulosis and int hem, o/w nl.  Rpt 2016 : adenomatous colon polyp x 1.  . Hypercholesteremia 1999  . Hypertension 1999  . Nephrolithiasis 1990s  . Obesity, Class I, BMI 30-34.9 12/22/2011    Past Surgical History:  Procedure Laterality Date  . COLONOSCOPY  2006;6/302011;06/2015   2006-adenomatous polypectomy.  2011--diverticulosis.  No polyps.  2016 adenomatous polyp x 1.  . CYSTOSCOPY W/ URETEROSCOPY W/ LITHOTRIPSY  04/2012    Family History  Problem Relation Age of Onset  . Diabetes Mother     type 2  . Heart disease Mother     CABG  . Stroke Father     paralyzed on right side  . Diabetes Father     type 2    Social History   Social History  . Marital status: Widowed    Spouse name: N/A  . Number of children: N/A  . Years of education: N/A   Occupational History  . Not on file.   Social History Main Topics  . Smoking status: Former Smoker    Types: Cigarettes    Quit date: 11/15/1977  . Smokeless tobacco: Never Used  . Alcohol use Yes     Comment: 6 pk a week  . Drug use: No  . Sexual activity: Yes    Partners: Female   Other Topics Concern  . Not on file   Social History Narrative   Married, one grown  daughter.  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   No formal exercise.  Lives in [place] in [place].    Outpatient Medications Prior to Visit  Medication Sig Dispense Refill  . aspirin 81 MG tablet Take 81 mg by mouth daily.      Marland Kitchen atorvastatin (LIPITOR) 40 MG tablet TAKE 1 TABLET EVERY DAY 90 tablet 3  . doxazosin (CARDURA) 4 MG tablet Take 1 tablet (4 mg total) by mouth at bedtime. 90 tablet 3  . linagliptin (TRADJENTA) 5 MG TABS tablet Take 1 tablet (5 mg total) by mouth daily. 90 tablet 3  . metFORMIN (GLUCOPHAGE) 500 MG tablet TAKE 1 TABLET EVERY MORNING  AND TAKE 2 TABLETS EVERY EVENING 270 tablet 1  . pioglitazone (ACTOS) 45 MG tablet Take 1 tablet (45 mg total) by mouth daily. 14 tablet 0  . ramipril (ALTACE) 10 MG capsule TAKE 1 CAPSULE ONE TIME DAILY 90 capsule 3  . OMEGA 3 1200 MG CAPS Take 1 capsule by mouth 2 (two) times daily.       No facility-administered medications prior to visit.     No Known Allergies  ROS Review of Systems  Constitutional: Negative  for fatigue and fever.  HENT: Negative for congestion and sore throat.   Eyes: Negative for visual disturbance.  Respiratory: Negative for cough.   Cardiovascular: Negative for chest pain.  Gastrointestinal: Negative for abdominal pain and nausea.  Genitourinary: Negative for dysuria.  Musculoskeletal: Negative for back pain and joint swelling.  Skin: Negative for rash.  Neurological: Negative for weakness and headaches.  Hematological: Negative for adenopathy.    PE; Blood pressure (!) 145/82, pulse (!) 53, temperature 97.2 F (36.2 C), temperature source Oral, resp. rate 16, height 5' 9.25" (1.759 m), weight 197 lb 12 oz (89.7 kg), SpO2 97 %. Gen: Alert, well appearing.  Patient is oriented to person, place, time, and situation. AFFECT: pleasant, lucid thought and speech. Rectal exam: negative without mass, lesions or tenderness, PROSTATE EXAM: smooth and symmetric without nodules or  tenderness.  Pertinent labs:  Lab Results  Component Value Date   TSH 1.18 08/08/2014   Lab Results  Component Value Date   WBC 8.4 08/08/2014   HGB 14.1 08/08/2014   HCT 43.3 08/08/2014   MCV 95.6 08/08/2014   PLT 277.0 08/08/2014   Lab Results  Component Value Date   CREATININE 0.80 04/15/2016   BUN 14 04/15/2016   NA 133 (L) 04/15/2016   K 4.8 04/15/2016   CL 97 04/15/2016   CO2 30 04/15/2016   Lab Results  Component Value Date   ALT 14 10/21/2015   AST 18 10/21/2015   ALKPHOS 82 10/21/2015   BILITOT 0.4 10/21/2015   Lab Results  Component Value Date   CHOL 108 10/21/2015   Lab Results  Component Value Date   HDL 52.60 10/21/2015   Lab Results  Component Value Date   LDLCALC 47 10/21/2015   Lab Results  Component Value Date   TRIG 43.0 10/21/2015   Lab Results  Component Value Date   CHOLHDL 2 10/21/2015   Lab Results  Component Value Date   PSA 0.27 10/21/2015   PSA 0.22 08/08/2014   PSA 0.26 07/04/2013   Lab Results  Component Value Date   HGBA1C 6.3 04/15/2016    ASSESSMENT AND PLAN:   1) DM2, historically well controlled on current regimen. No home glucose monitoring. He is aware that he needs updated eye exam.  2) HTN: The current medical regimen is effective;  continue present plan and medications. Check lytes/cr today.  3) HLD: tolerating statin.  Do FLP and AST/ALT monitoring today.  4) Prostate cancer screening: DRE normal today.  PSA drawn today.  An After Visit Summary was printed and given to the patient.  FOLLOW UP:  Return in about 6 months (around 04/19/2017) for routine chronic illness f/u (30 min).  Signed:  Crissie Sickles, MD           10/19/2016

## 2016-10-21 ENCOUNTER — Other Ambulatory Visit: Payer: Self-pay | Admitting: Family Medicine

## 2016-10-22 NOTE — Telephone Encounter (Signed)
RF request for ramipril LOV: 10/19/16 Next ov: 04/19/17 Last written: 09/19/15 #90 w/ 3RF  RF request for atorvastatin Last written: 09/19/15 #90 w/ 3RF

## 2017-02-02 ENCOUNTER — Other Ambulatory Visit: Payer: Self-pay | Admitting: Family Medicine

## 2017-03-24 ENCOUNTER — Other Ambulatory Visit: Payer: Self-pay | Admitting: Family Medicine

## 2017-04-19 ENCOUNTER — Encounter: Payer: Self-pay | Admitting: Family Medicine

## 2017-04-19 ENCOUNTER — Ambulatory Visit (INDEPENDENT_AMBULATORY_CARE_PROVIDER_SITE_OTHER): Payer: Medicare Other | Admitting: Family Medicine

## 2017-04-19 VITALS — BP 110/63 | HR 64 | Temp 97.7°F | Resp 16 | Ht 69.25 in | Wt 198.2 lb

## 2017-04-19 DIAGNOSIS — I10 Essential (primary) hypertension: Secondary | ICD-10-CM

## 2017-04-19 DIAGNOSIS — E7800 Pure hypercholesterolemia, unspecified: Secondary | ICD-10-CM

## 2017-04-19 DIAGNOSIS — Z9189 Other specified personal risk factors, not elsewhere classified: Secondary | ICD-10-CM | POA: Diagnosis not present

## 2017-04-19 DIAGNOSIS — E119 Type 2 diabetes mellitus without complications: Secondary | ICD-10-CM | POA: Diagnosis not present

## 2017-04-19 DIAGNOSIS — Z1159 Encounter for screening for other viral diseases: Secondary | ICD-10-CM

## 2017-04-19 LAB — HEMOGLOBIN A1C: Hgb A1c MFr Bld: 6.5 % (ref 4.6–6.5)

## 2017-04-19 LAB — BASIC METABOLIC PANEL
BUN: 15 mg/dL (ref 6–23)
CHLORIDE: 103 meq/L (ref 96–112)
CO2: 26 meq/L (ref 19–32)
Calcium: 9.4 mg/dL (ref 8.4–10.5)
Creatinine, Ser: 0.79 mg/dL (ref 0.40–1.50)
GFR: 103.57 mL/min (ref >60.00)
Glucose, Bld: 111 mg/dL — ABNORMAL HIGH (ref 70–99)
Potassium: 4.7 mEq/L (ref 3.5–5.1)
SODIUM: 137 meq/L (ref 135–145)

## 2017-04-19 LAB — HEPATITIS C ANTIBODY: HCV Ab: NEGATIVE

## 2017-04-19 NOTE — Progress Notes (Signed)
OFFICE VISIT  04/19/2017   CC:  Chief Complaint  Patient presents with  . Follow-up    RCI, pt is fasting.   HPI:    Patient is a 66 y.o. Caucasian male who presents for 6 mo f/u DM 2, HTN, HLD.  DM 2: no home monitoring.  Diabetic diet ok. HTN: no home monitoring.  Compliant with meds. Exercise: yard work. HLD: compliant with statin, no side effects.  Pt wants to get Hep C screening today.  Review of Systems  Constitutional: Negative for fatigue and fever.  HENT: Negative for congestion and sore throat.   Eyes: Negative for visual disturbance.  Respiratory: Negative for cough.   Cardiovascular: Negative for chest pain.  Gastrointestinal: Negative for abdominal pain and nausea.  Genitourinary: Negative for dysuria.  Musculoskeletal: Negative for back pain and joint swelling.  Skin: Negative for rash.  Neurological: Negative for weakness and headaches.  Hematological: Negative for adenopathy.   Past Medical History:  Diagnosis Date  . Degenerative joint disease    C-spine  . Diabetes mellitus 1998   No neuropathy or nephropathy.  Last D.R exam (neg) 03/20/15.  Marland Kitchen Hearing impairment   . Hx of adenomatous colonic polyps 2006; 2011;2016   Colonoscopy 2006+ aden.col. pol.  Rpt 05/14/2010 showed mild diverticulosis and int hem, o/w nl.  Rpt 2016 : adenomatous colon polyp x 1.  . Hypercholesteremia 1999  . Hypertension 1999  . Nephrolithiasis 1990s  . Obesity, Class I, BMI 30-34.9 12/22/2011    Past Surgical History:  Procedure Laterality Date  . COLONOSCOPY  2006;6/302011;06/2015   2006-adenomatous polypectomy.  2011--diverticulosis.  No polyps.  2016 adenomatous polyp x 1.  . CYSTOSCOPY W/ URETEROSCOPY W/ LITHOTRIPSY  04/2012    Outpatient Medications Prior to Visit  Medication Sig Dispense Refill  . aspirin 81 MG tablet Take 81 mg by mouth daily.      Marland Kitchen atorvastatin (LIPITOR) 40 MG tablet TAKE 1 TABLET EVERY DAY 90 tablet 3  . doxazosin (CARDURA) 4 MG tablet Take 1  tablet (4 mg total) by mouth at bedtime. 90 tablet 3  . linagliptin (TRADJENTA) 5 MG TABS tablet Take 1 tablet (5 mg total) by mouth daily. 90 tablet 3  . metFORMIN (GLUCOPHAGE) 500 MG tablet TAKE 1 TABLET EVERY MORNING  AND TAKE 2 TABLETS EVERY EVENING 270 tablet 1  . pioglitazone (ACTOS) 45 MG tablet TAKE 1 TABLET EVERY DAY 90 tablet 1  . ramipril (ALTACE) 10 MG capsule TAKE 1 CAPSULE ONE TIME DAILY 90 capsule 3   No facility-administered medications prior to visit.     No Known Allergies  ROS As per HPI  PE: Blood pressure 110/63, pulse 64, temperature 97.7 F (36.5 C), temperature source Oral, resp. rate 16, height 5' 9.25" (1.759 m), weight 198 lb 4 oz (89.9 kg), SpO2 95 %. Body mass index is 29.07 kg/m.  Gen: Alert, well appearing.  Patient is oriented to person, place, time, and situation. AFFECT: pleasant, lucid thought and speech. Foot exam - bilateral normal; no swelling, tenderness or skin or vascular lesions. Color and temperature is normal. Sensation is intact. Peripheral pulses are palpable. Toenails are normal.   LABS:  Lab Results  Component Value Date   TSH 1.18 08/08/2014   Lab Results  Component Value Date   WBC 8.4 08/08/2014   HGB 14.1 08/08/2014   HCT 43.3 08/08/2014   MCV 95.6 08/08/2014   PLT 277.0 08/08/2014   Lab Results  Component Value Date   CREATININE 0.69  10/19/2016   BUN 12 10/19/2016   NA 138 10/19/2016   K 4.2 10/19/2016   CL 102 10/19/2016   CO2 27 10/19/2016   Lab Results  Component Value Date   ALT 17 10/19/2016   AST 19 10/19/2016   ALKPHOS 67 10/19/2016   BILITOT 0.8 10/19/2016   Lab Results  Component Value Date   CHOL 118 10/19/2016   Lab Results  Component Value Date   HDL 54.30 10/19/2016   Lab Results  Component Value Date   LDLCALC 53 10/19/2016   Lab Results  Component Value Date   TRIG 53.0 10/19/2016   Lab Results  Component Value Date   CHOLHDL 2 10/19/2016   Lab Results  Component Value Date    PSA 0.44 10/19/2016   PSA 0.27 10/21/2015   PSA 0.22 08/08/2014   Lab Results  Component Value Date   HGBA1C 6.3 10/19/2016   IMPRESSION AND PLAN:  1) DM 2; historically well controlled. Feet exam normal today. HbA1c today.  2) HTN; The current medical regimen is effective;  continue present plan and medications. Lytes/cr today.  3) Hyperlipidemia: tolerating statin.  Excellent lipid panel 10/2016--will repeat at next f/u.  AST/ALT normal 10/2016.  4) Screening: hep C screening done today.  An After Visit Summary was printed and given to the patient.  FOLLOW UP: Return in about 6 months (around 10/19/2017) for routine chronic illness f/u.  Signed:  Crissie Sickles, MD           04/19/2017

## 2017-04-26 ENCOUNTER — Ambulatory Visit: Payer: Self-pay

## 2017-05-23 ENCOUNTER — Other Ambulatory Visit: Payer: Self-pay | Admitting: Family Medicine

## 2017-05-23 NOTE — Telephone Encounter (Signed)
[place].  RF request for doxazosin LOV: 04/19/17 Next ov: 10/19/17 Last written: 04/15/16 #90 w/ 3RF  RF request for tradjenta Last written: 04/15/16 #90 w/ 3RF

## 2017-06-06 ENCOUNTER — Other Ambulatory Visit: Payer: Self-pay

## 2017-08-07 ENCOUNTER — Other Ambulatory Visit: Payer: Self-pay | Admitting: Family Medicine

## 2017-08-29 ENCOUNTER — Other Ambulatory Visit: Payer: Self-pay | Admitting: Family Medicine

## 2017-09-08 DIAGNOSIS — Z23 Encounter for immunization: Secondary | ICD-10-CM | POA: Diagnosis not present

## 2017-10-19 ENCOUNTER — Other Ambulatory Visit: Payer: Self-pay

## 2017-10-19 ENCOUNTER — Ambulatory Visit (INDEPENDENT_AMBULATORY_CARE_PROVIDER_SITE_OTHER): Payer: Medicare Other | Admitting: Family Medicine

## 2017-10-19 ENCOUNTER — Encounter: Payer: Self-pay | Admitting: Family Medicine

## 2017-10-19 VITALS — BP 135/74 | HR 59 | Temp 97.5°F | Resp 16 | Ht 69.25 in | Wt 195.0 lb

## 2017-10-19 DIAGNOSIS — Z125 Encounter for screening for malignant neoplasm of prostate: Secondary | ICD-10-CM | POA: Diagnosis not present

## 2017-10-19 DIAGNOSIS — I10 Essential (primary) hypertension: Secondary | ICD-10-CM | POA: Diagnosis not present

## 2017-10-19 DIAGNOSIS — E7800 Pure hypercholesterolemia, unspecified: Secondary | ICD-10-CM

## 2017-10-19 DIAGNOSIS — E119 Type 2 diabetes mellitus without complications: Secondary | ICD-10-CM

## 2017-10-19 LAB — COMPREHENSIVE METABOLIC PANEL
ALT: 17 U/L (ref 0–53)
AST: 20 U/L (ref 0–37)
Albumin: 4.4 g/dL (ref 3.5–5.2)
Alkaline Phosphatase: 58 U/L (ref 39–117)
BUN: 13 mg/dL (ref 6–23)
CALCIUM: 9.2 mg/dL (ref 8.4–10.5)
CO2: 26 meq/L (ref 19–32)
CREATININE: 0.7 mg/dL (ref 0.40–1.50)
Chloride: 101 mEq/L (ref 96–112)
GFR: 118.9 mL/min (ref >60.00)
Glucose, Bld: 102 mg/dL — ABNORMAL HIGH (ref 70–99)
Potassium: 4.3 mEq/L (ref 3.5–5.1)
Sodium: 136 mEq/L (ref 135–145)
Total Bilirubin: 0.6 mg/dL (ref 0.2–1.2)
Total Protein: 7.6 g/dL (ref 6.0–8.3)

## 2017-10-19 LAB — LIPID PANEL
CHOL/HDL RATIO: 2
Cholesterol: 110 mg/dL (ref 0–200)
HDL: 53.6 mg/dL (ref >39.00)
LDL CALC: 47 mg/dL (ref 0–99)
NonHDL: 56.72
TRIGLYCERIDES: 49 mg/dL (ref 0.0–149.0)
VLDL: 9.8 mg/dL (ref 0.0–40.0)

## 2017-10-19 LAB — PSA, MEDICARE: PSA: 0.35 ng/ml (ref 0.10–4.00)

## 2017-10-19 LAB — HEMOGLOBIN A1C: Hgb A1c MFr Bld: 6.1 % (ref 4.6–6.5)

## 2017-10-19 MED ORDER — ZOSTER VAC RECOMB ADJUVANTED 50 MCG/0.5ML IM SUSR: 0.5000 mL | Freq: Once | INTRAMUSCULAR | 1 refills | Status: AC

## 2017-10-19 NOTE — Progress Notes (Signed)
OFFICE VISIT  10/19/2017   CC:  Chief Complaint  Patient presents with  . Follow-up    RCI, pt is fasting.    HPI:    Patient is a 66 y.o. Caucasian male who presents for 6 mo f/u DM 2, HTN, HLD.  Recent rash on back of left shoulder x 3 days, itchy, seems better this morning. Has been working in yard, doing leaves a lot lately.  DM: no home monitoring.  No sx's of hyperglycemia or hypoglycemia.   Less stress lately has him feeling better.  HTN: no home monitoring.  No HAs, dizziness, fatigue, CP, or SOB.  HLD: tolerating statin well.  Compliant with med.  He has not had prostate ca screening in 1 year.  He is agreeable to doing this today. ROS: no melena, hematochezia, abd pain, n/v, fevers, or resp sx's.  Past Medical History:  Diagnosis Date  . Degenerative joint disease    C-spine  . Diabetes mellitus 1998   No neuropathy or nephropathy.  Last D.R exam (neg) 03/20/15.  Marland Kitchen Hearing impairment   . Hx of adenomatous colonic polyps 2006; 2011;2016   Colonoscopy 2006+ aden.col. pol.  Rpt 05/14/2010 showed mild diverticulosis and int hem, o/w nl.  Rpt 2016 : adenomatous colon polyp x 1.  . Hypercholesteremia 1999  . Hypertension 1999  . Nephrolithiasis 1990s  . Obesity, Class I, BMI 30-34.9 12/22/2011    Past Surgical History:  Procedure Laterality Date  . COLONOSCOPY  2006;6/302011;06/2015   2006-adenomatous polypectomy.  2011--diverticulosis.  No polyps.  2016 adenomatous polyp x 1.  . CYSTOSCOPY W/ URETEROSCOPY W/ LITHOTRIPSY  04/2012    Outpatient Medications Prior to Visit  Medication Sig Dispense Refill  . aspirin 81 MG tablet Take 81 mg by mouth daily.      Marland Kitchen atorvastatin (LIPITOR) 40 MG tablet TAKE 1 TABLET EVERY DAY 90 tablet 3  . doxazosin (CARDURA) 4 MG tablet TAKE 1 TABLET AT BEDTIME 90 tablet 1  . metFORMIN (GLUCOPHAGE) 500 MG tablet TAKE 1 TABLET EVERY MORNING  AND TAKE 2 TABLETS EVERY EVENING 270 tablet 1  . pioglitazone (ACTOS) 45 MG tablet TAKE 1 TABLET  EVERY DAY 90 tablet 0  . ramipril (ALTACE) 10 MG capsule TAKE 1 CAPSULE ONE TIME DAILY 90 capsule 3  . TRADJENTA 5 MG TABS tablet TAKE 1 TABLET EVERY DAY 90 tablet 1   No facility-administered medications prior to visit.     No Known Allergies  ROS As per HPI  PE: Blood pressure 135/74, pulse (!) 59, temperature (!) 97.5 F (36.4 C), temperature source Oral, resp. rate 16, height 5' 9.25" (1.759 m), weight 195 lb (88.5 kg), SpO2 96 %. Gen: Alert, well appearing.  Patient is oriented to person, place, time, and situation. AFFECT: pleasant, lucid thought and speech. SKIN: just a trace of pink, papular rash about 2-3 cm sized patch on back of L shoulder/axilla area.  Otherwise the skin just looks mildly try.   Rectal exam: negative without mass, lesions or tenderness, PROSTATE EXAM: smooth and symmetric without nodules or tenderness.   LABS:  Lab Results  Component Value Date   TSH 1.18 08/08/2014   Lab Results  Component Value Date   WBC 8.4 08/08/2014   HGB 14.1 08/08/2014   HCT 43.3 08/08/2014   MCV 95.6 08/08/2014   PLT 277.0 08/08/2014   Lab Results  Component Value Date   CREATININE 0.79 04/19/2017   BUN 15 04/19/2017   NA 137 04/19/2017   K  4.7 04/19/2017   CL 103 04/19/2017   CO2 26 04/19/2017   Lab Results  Component Value Date   ALT 17 10/19/2016   AST 19 10/19/2016   ALKPHOS 67 10/19/2016   BILITOT 0.8 10/19/2016   Lab Results  Component Value Date   CHOL 118 10/19/2016   Lab Results  Component Value Date   HDL 54.30 10/19/2016   Lab Results  Component Value Date   LDLCALC 53 10/19/2016   Lab Results  Component Value Date   TRIG 53.0 10/19/2016   Lab Results  Component Value Date   CHOLHDL 2 10/19/2016   Lab Results  Component Value Date   PSA 0.44 10/19/2016   PSA 0.27 10/21/2015   PSA 0.22 08/08/2014   Lab Results  Component Value Date   HGBA1C 6.5 04/19/2017    IMPRESSION AND PLAN:  1) DM 2; historically very well  controlled. If A1c stable today, will see how he does off tradjenta--but continue pioglit and metformin. He'll schedule eye exam.  2) HTN: The current medical regimen is effective;  continue present plan and medications. Lytes/cr today.  3) HLD: tolerating statin. FLP today.  4) Prostate ca screening: DRE normal today, PSA drawn.  5) Mild dermatitis: likely mild rxn to something he came in contact with in his yard. +Xerosis.  Apply otc hydrocortisone ointment to focal dermatitis area.  Moisturize with cream/lotion regularly.  An After Visit Summary was printed and given to the patient.  FOLLOW UP: Return in about 6 months (around 04/19/2018) for routine chronic illness f/u.  Signed:  Crissie Sickles, MD           10/19/2017

## 2017-11-25 ENCOUNTER — Encounter: Payer: Self-pay | Admitting: Family Medicine

## 2017-11-25 DIAGNOSIS — Z7984 Long term (current) use of oral hypoglycemic drugs: Secondary | ICD-10-CM | POA: Diagnosis not present

## 2017-11-25 DIAGNOSIS — H524 Presbyopia: Secondary | ICD-10-CM | POA: Diagnosis not present

## 2017-11-25 DIAGNOSIS — H401131 Primary open-angle glaucoma, bilateral, mild stage: Secondary | ICD-10-CM | POA: Diagnosis not present

## 2017-11-25 DIAGNOSIS — H2513 Age-related nuclear cataract, bilateral: Secondary | ICD-10-CM | POA: Diagnosis not present

## 2017-11-25 DIAGNOSIS — E119 Type 2 diabetes mellitus without complications: Secondary | ICD-10-CM | POA: Diagnosis not present

## 2017-11-25 LAB — HM DIABETES EYE EXAM

## 2017-11-29 ENCOUNTER — Other Ambulatory Visit: Payer: Self-pay | Admitting: Family Medicine

## 2017-12-26 DIAGNOSIS — H401131 Primary open-angle glaucoma, bilateral, mild stage: Secondary | ICD-10-CM | POA: Diagnosis not present

## 2018-02-07 ENCOUNTER — Ambulatory Visit: Payer: Self-pay | Admitting: *Deleted

## 2018-02-07 ENCOUNTER — Other Ambulatory Visit: Payer: Self-pay | Admitting: Family Medicine

## 2018-02-07 MED ORDER — METFORMIN HCL 500 MG PO TABS: ORAL_TABLET | ORAL | 0 refills | Status: DC

## 2018-02-07 MED ORDER — RAMIPRIL 10 MG PO CAPS: 10.0000 mg | ORAL_CAPSULE | Freq: Every day | ORAL | 0 refills | Status: DC

## 2018-02-07 MED ORDER — ATORVASTATIN CALCIUM 40 MG PO TABS: 40.0000 mg | ORAL_TABLET | Freq: Every day | ORAL | 0 refills | Status: DC

## 2018-02-07 MED ORDER — PIOGLITAZONE HCL 45 MG PO TABS: 45.0000 mg | ORAL_TABLET | Freq: Every day | ORAL | 0 refills | Status: DC

## 2018-02-07 MED ORDER — DOXAZOSIN MESYLATE 4 MG PO TABS: 4.0000 mg | ORAL_TABLET | Freq: Every day | ORAL | 0 refills | Status: DC

## 2018-02-07 NOTE — Telephone Encounter (Signed)
Returned   Patients   Phone  Call   Spoke  To  pts  Wife  Thomas Boyle  States  She  Just  Spoke  To  Chama  The  Patients needs were  Addressed by  Her.

## 2018-02-07 NOTE — Telephone Encounter (Signed)
Pt also need a refill on the Ramipril 10mg    Actos 45mg   And Cardura 4mg  they need a 5 day supply

## 2018-02-07 NOTE — Telephone Encounter (Signed)
Rx's sent in for 5 day supply.  Patient aware.

## 2018-02-07 NOTE — Telephone Encounter (Signed)
Pt has been feeling dizzy since yesterday he has been cutting back on his medicines because he was running out of medicines

## 2018-02-07 NOTE — Telephone Encounter (Signed)
Copied from [place] [id number]. Topic: Quick Communication - Rx Refill/Question >> Feb 07, 2018  3:20 PM Waylan Rocher, Lumin L wrote: Medication: atorvastatin (LIPITOR) 40 MG tablet, metFORMIN (GLUCOPHAGE) 500 MG tablet (patient would like a 5 day supply of these and 2 or 3 others) Has the patient contacted their pharmacy? No. (out of town [place]) (Agent: If no, request that the patient contact the pharmacy for the refill.) Preferred Pharmacy (with phone number or street name): [id number] [address], [place] [address] [place] [address] Phone: [phone] Fax: [phone] Agent: Please be advised that RX refills may take up to 3 business days. We ask that you follow-up with your pharmacy. Patient is out of town and needs a 5 days supply of all of his medications

## 2018-02-22 ENCOUNTER — Other Ambulatory Visit: Payer: Self-pay | Admitting: Family Medicine

## 2018-03-31 DIAGNOSIS — H401131 Primary open-angle glaucoma, bilateral, mild stage: Secondary | ICD-10-CM | POA: Insufficient documentation

## 2018-04-11 ENCOUNTER — Ambulatory Visit (INDEPENDENT_AMBULATORY_CARE_PROVIDER_SITE_OTHER): Payer: Medicare Other | Admitting: Family Medicine

## 2018-04-11 ENCOUNTER — Encounter: Payer: Self-pay | Admitting: Family Medicine

## 2018-04-11 VITALS — BP 111/64 | HR 48 | Temp 97.9°F | Resp 16 | Ht 69.25 in | Wt 194.2 lb

## 2018-04-11 DIAGNOSIS — E7800 Pure hypercholesterolemia, unspecified: Secondary | ICD-10-CM

## 2018-04-11 DIAGNOSIS — E119 Type 2 diabetes mellitus without complications: Secondary | ICD-10-CM | POA: Diagnosis not present

## 2018-04-11 DIAGNOSIS — I10 Essential (primary) hypertension: Secondary | ICD-10-CM | POA: Diagnosis not present

## 2018-04-11 LAB — HEMOGLOBIN A1C: Hgb A1c MFr Bld: 6.6 % — ABNORMAL HIGH (ref 4.6–6.5)

## 2018-04-11 LAB — BASIC METABOLIC PANEL
BUN: 13 mg/dL (ref 6–23)
CO2: 29 mEq/L (ref 19–32)
CREATININE: 0.68 mg/dL (ref 0.40–1.50)
Calcium: 9.5 mg/dL (ref 8.4–10.5)
Chloride: 99 mEq/L (ref 96–112)
GFR: 122.78 mL/min (ref >60.00)
Glucose, Bld: 116 mg/dL — ABNORMAL HIGH (ref 70–99)
Potassium: 4.7 mEq/L (ref 3.5–5.1)
Sodium: 136 mEq/L (ref 135–145)

## 2018-04-11 NOTE — Progress Notes (Signed)
OFFICE VISIT  04/11/2018   CC:  Chief Complaint  Patient presents with  . Follow-up    RCI, pt is fasting.      HPI:    Patient is a 67 y.o. Caucasian male who presents for 6 mo f/u DM 2, HTN, HLD.  DM: no home glucose monitoring.  Feels no different since stopping tradjenta. Diet is good, he is very active but no formal exercise. HTN: no regular home bp monitoring but occ check is <130/80.  HLD: no side effects from atorvastatin.  ROS: no HAs, no polyuria/polydipsia, no CP, no SOb, no myalgias, no vision c/o, no rash, no fever.  Past Medical History:  Diagnosis Date  . Degenerative joint disease    C-spine  . Diabetes mellitus 1998   No neuropathy or nephropathy.  Last D.R exam (neg) 11/25/17.  . Glaucoma    Primary open angle OU--travatan Z drops per Dr. Gershon Crane  . Hearing impairment   . Hx of adenomatous colonic polyps 2006; 2011;2016   Colonoscopy 2006+ aden.col. pol.  Rpt 05/14/2010 showed mild diverticulosis and int hem, o/w nl.  Rpt 2016 : adenomatous colon polyp x 1.  . Hypercholesteremia 1999  . Hypertension 1999  . Nephrolithiasis 1990s  . Nuclear sclerotic cataract of both eyes    Dr. Gershon Crane  . Obesity, Class I, BMI 30-34.9 12/22/2011    Past Surgical History:  Procedure Laterality Date  . COLONOSCOPY  2006;6/302011;06/2015   2006-adenomatous polypectomy.  2011--diverticulosis.  No polyps.  2016 adenomatous polyp x 1.  . CYSTOSCOPY W/ URETEROSCOPY W/ LITHOTRIPSY  04/2012    Outpatient Medications Prior to Visit  Medication Sig Dispense Refill  . aspirin 81 MG tablet Take 81 mg by mouth daily.      Marland Kitchen atorvastatin (LIPITOR) 40 MG tablet Take 1 tablet (40 mg total) by mouth daily. 5 tablet 0  . doxazosin (CARDURA) 4 MG tablet Take 1 tablet (4 mg total) by mouth at bedtime. 5 tablet 0  . latanoprost (XALATAN) 0.005 % ophthalmic solution Place 1 drop into both eyes at bedtime.    . metFORMIN (GLUCOPHAGE) 500 MG tablet TAKE 1 TABLET EVERY MORNING AND 2 TABLETS  EVERY EVENING 270 tablet 1  . pioglitazone (ACTOS) 45 MG tablet Take 1 tablet (45 mg total) by mouth daily. 5 tablet 0  . ramipril (ALTACE) 10 MG capsule Take 1 capsule (10 mg total) by mouth daily. 5 capsule 0  . TRADJENTA 5 MG TABS tablet TAKE 1 TABLET EVERY DAY (Patient not taking: Reported on 04/11/2018) 90 tablet 1   No facility-administered medications prior to visit.     No Known Allergies  ROS As per HPI  PE: Blood pressure 111/64, pulse (!) 48, temperature 97.9 F (36.6 C), temperature source Oral, resp. rate 16, height 5' 9.25" (1.759 m), weight 194 lb 4 oz (88.1 kg), SpO2 96 %. Gen: Alert, well appearing.  Patient is oriented to person, place, time, and situation. AFFECT: pleasant, lucid thought and speech. CV: RRR, no m/r/g.   LUNGS: CTA bilat, nonlabored resps, good aeration in all lung fields. EXT: no clubbing, cyanosis, or edema.   LABS:  Lab Results  Component Value Date   TSH 1.18 08/08/2014   Lab Results  Component Value Date   WBC 8.4 08/08/2014   HGB 14.1 08/08/2014   HCT 43.3 08/08/2014   MCV 95.6 08/08/2014   PLT 277.0 08/08/2014   Lab Results  Component Value Date   CREATININE 0.70 10/19/2017   BUN 13 10/19/2017  NA 136 10/19/2017   K 4.3 10/19/2017   CL 101 10/19/2017   CO2 26 10/19/2017   Lab Results  Component Value Date   ALT 17 10/19/2017   AST 20 10/19/2017   ALKPHOS 58 10/19/2017   BILITOT 0.6 10/19/2017   Lab Results  Component Value Date   CHOL 110 10/19/2017   Lab Results  Component Value Date   HDL 53.60 10/19/2017   Lab Results  Component Value Date   LDLCALC 47 10/19/2017   Lab Results  Component Value Date   TRIG 49.0 10/19/2017   Lab Results  Component Value Date   CHOLHDL 2 10/19/2017   Lab Results  Component Value Date   PSA 0.35 10/19/2017   PSA 0.44 10/19/2016   PSA 0.27 10/21/2015   Lab Results  Component Value Date   HGBA1C 6.1 10/19/2017    IMPRESSION AND PLAN:  1) DM 2: if A1c <7.5% then  we'll go ahead and d/c pioglitazone.  2) HTN: The current medical regimen is effective;  continue present plan and medications. Lytes/cr today.  3) HLD: tolerating statin.  Last lipid panel 10/2017 was excellent. Repeat in 6 months.  An After Visit Summary was printed and given to the patient.  FOLLOW UP: Return in about 6 months (around 10/12/2018) for routine chronic illness f/u (fasting).  Signed:  Crissie Sickles, MD           04/11/2018

## 2018-04-12 ENCOUNTER — Encounter: Payer: Self-pay | Admitting: *Deleted

## 2018-04-12 LAB — MICROALBUMIN / CREATININE URINE RATIO
Creatinine,U: 43.6 mg/dL
Microalb Creat Ratio: 1.6 mg/g (ref 0.0–30.0)

## 2018-04-19 ENCOUNTER — Ambulatory Visit: Payer: Self-pay | Admitting: Family Medicine

## 2018-05-22 ENCOUNTER — Other Ambulatory Visit: Payer: Self-pay | Admitting: Family Medicine

## 2018-06-20 DIAGNOSIS — H401131 Primary open-angle glaucoma, bilateral, mild stage: Secondary | ICD-10-CM | POA: Diagnosis not present

## 2018-08-25 ENCOUNTER — Other Ambulatory Visit: Payer: Self-pay | Admitting: Family Medicine

## 2018-09-21 DIAGNOSIS — H401131 Primary open-angle glaucoma, bilateral, mild stage: Secondary | ICD-10-CM | POA: Diagnosis not present

## 2018-09-22 ENCOUNTER — Ambulatory Visit (INDEPENDENT_AMBULATORY_CARE_PROVIDER_SITE_OTHER): Payer: Medicare Other

## 2018-09-22 DIAGNOSIS — Z23 Encounter for immunization: Secondary | ICD-10-CM | POA: Diagnosis not present

## 2018-10-16 ENCOUNTER — Encounter: Payer: Self-pay | Admitting: Family Medicine

## 2018-10-16 ENCOUNTER — Ambulatory Visit (INDEPENDENT_AMBULATORY_CARE_PROVIDER_SITE_OTHER): Payer: Medicare Other | Admitting: Family Medicine

## 2018-10-16 VITALS — BP 131/78 | HR 60 | Temp 97.5°F | Resp 16 | Ht 69.25 in | Wt 197.1 lb

## 2018-10-16 DIAGNOSIS — E7800 Pure hypercholesterolemia, unspecified: Secondary | ICD-10-CM

## 2018-10-16 DIAGNOSIS — Z125 Encounter for screening for malignant neoplasm of prostate: Secondary | ICD-10-CM | POA: Diagnosis not present

## 2018-10-16 DIAGNOSIS — E663 Overweight: Secondary | ICD-10-CM

## 2018-10-16 DIAGNOSIS — I10 Essential (primary) hypertension: Secondary | ICD-10-CM

## 2018-10-16 DIAGNOSIS — E119 Type 2 diabetes mellitus without complications: Secondary | ICD-10-CM

## 2018-10-16 LAB — LIPID PANEL
CHOLESTEROL: 128 mg/dL (ref 0–200)
HDL: 52.2 mg/dL (ref >39.00)
LDL CALC: 59 mg/dL (ref 0–99)
NonHDL: 76.05
Total CHOL/HDL Ratio: 2
Triglycerides: 84 mg/dL (ref 0.0–149.0)
VLDL: 16.8 mg/dL (ref 0.0–40.0)

## 2018-10-16 LAB — COMPREHENSIVE METABOLIC PANEL
ALBUMIN: 4.7 g/dL (ref 3.5–5.2)
ALT: 23 U/L (ref 0–53)
AST: 23 U/L (ref 0–37)
Alkaline Phosphatase: 70 U/L (ref 39–117)
BUN: 15 mg/dL (ref 6–23)
CO2: 28 mEq/L (ref 19–32)
Calcium: 9.8 mg/dL (ref 8.4–10.5)
Chloride: 98 mEq/L (ref 96–112)
Creatinine, Ser: 0.79 mg/dL (ref 0.40–1.50)
GFR: 103.11 mL/min (ref >60.00)
Glucose, Bld: 129 mg/dL — ABNORMAL HIGH (ref 70–99)
Potassium: 4.3 mEq/L (ref 3.5–5.1)
Sodium: 136 mEq/L (ref 135–145)
Total Bilirubin: 0.7 mg/dL (ref 0.2–1.2)
Total Protein: 8 g/dL (ref 6.0–8.3)

## 2018-10-16 LAB — HEMOGLOBIN A1C: Hgb A1c MFr Bld: 6.6 % — ABNORMAL HIGH (ref 4.6–6.5)

## 2018-10-16 LAB — PSA, MEDICARE: PSA: 0.35 ng/ml (ref 0.10–4.00)

## 2018-10-16 NOTE — Progress Notes (Signed)
OFFICE VISIT  10/16/2018   CC:  Chief Complaint  Patient presents with  . Follow-up    RCI, pt is fasting.     HPI:    Patient is a 67 y.o. Caucasian male who presents for 6 mo f/u DM 2, HTN, HLD.  DM: no home gluc monitoring. No burning, tingling, or numbness in feet. HTN: no home bp checks. HLD: tolerating statin.  Exercise: none, very active in hard, etc. Diet: tries to eat diabetic diet.  ROS: no CP, no SOB, no wheezing, no cough, no dizziness, no HAs, no rashes, no melena/hematochezia.  No polyuria or polydipsia.  No myalgias or arthralgias.   Past Medical History:  Diagnosis Date  . Degenerative joint disease    C-spine  . Diabetes mellitus 1998   No neuropathy or nephropathy.  Last D.R exam (neg) 11/25/17.  . Glaucoma    Primary open angle OU--travatan Z drops per Dr. Gershon Crane  . Hearing impairment   . Hx of adenomatous colonic polyps 2006; 2011;2016   Colonoscopy 2006+ aden.col. pol.  Rpt 05/14/2010 showed mild diverticulosis and int hem, o/w nl.  Rpt 2016 : adenomatous colon polyp x 1.  . Hypercholesteremia 1999  . Hypertension 1999  . Nephrolithiasis 1990s  . Nuclear sclerotic cataract of both eyes    Dr. Gershon Crane  . Obesity, Class I, BMI 30-34.9 12/22/2011    Past Surgical History:  Procedure Laterality Date  . COLONOSCOPY  2006;6/302011;06/2015   2006-adenomatous polypectomy.  2011--diverticulosis.  No polyps.  2016 adenomatous polyp x 1.  . CYSTOSCOPY W/ URETEROSCOPY W/ LITHOTRIPSY  04/2012    Outpatient Medications Prior to Visit  Medication Sig Dispense Refill  . aspirin 81 MG tablet Take 81 mg by mouth daily.      Marland Kitchen atorvastatin (LIPITOR) 40 MG tablet TAKE 1 TABLET EVERY DAY 90 tablet 1  . doxazosin (CARDURA) 4 MG tablet TAKE 1 TABLET AT BEDTIME 90 tablet 1  . latanoprost (XALATAN) 0.005 % ophthalmic solution Place 1 drop into both eyes at bedtime.    . metFORMIN (GLUCOPHAGE) 500 MG tablet TAKE 1 TABLET EVERY MORNING AND 2 TABLETS EVERY EVENING 270  tablet 1  . ramipril (ALTACE) 10 MG capsule TAKE 1 CAPSULE EVERY DAY 90 capsule 1  . pioglitazone (ACTOS) 45 MG tablet Take 1 tablet (45 mg total) by mouth daily. (Patient not taking: Reported on 10/16/2018) 5 tablet 0   No facility-administered medications prior to visit.     No Known Allergies  ROS As per HPI  PE: Blood pressure 131/78, pulse 60, temperature (!) 97.5 F (36.4 C), temperature source Oral, resp. rate 16, height 5' 9.25" (1.759 m), weight 197 lb 2 oz (89.4 kg), SpO2 96 %. Body mass index is 28.9 kg/m.  Gen: Alert, well appearing.  Patient is oriented to person, place, time, and situation. AFFECT: pleasant, lucid thought and speech. Foot exam - bilateral normal; no swelling, tenderness or skin or vascular lesions. Color and temperature is normal. Sensation is intact. Peripheral pulses are palpable. Toenails are normal. Rectal exam: negative without mass, lesions or tenderness, PROSTATE EXAM: smooth and symmetric without nodules or tenderness.   LABS:  Lab Results  Component Value Date   TSH 1.18 08/08/2014   Lab Results  Component Value Date   WBC 8.4 08/08/2014   HGB 14.1 08/08/2014   HCT 43.3 08/08/2014   MCV 95.6 08/08/2014   PLT 277.0 08/08/2014   Lab Results  Component Value Date   CREATININE 0.68 04/11/2018  BUN 13 04/11/2018   NA 136 04/11/2018   K 4.7 04/11/2018   CL 99 04/11/2018   CO2 29 04/11/2018   Lab Results  Component Value Date   ALT 17 10/19/2017   AST 20 10/19/2017   ALKPHOS 58 10/19/2017   BILITOT 0.6 10/19/2017   Lab Results  Component Value Date   CHOL 110 10/19/2017   Lab Results  Component Value Date   HDL 53.60 10/19/2017   Lab Results  Component Value Date   LDLCALC 47 10/19/2017   Lab Results  Component Value Date   TRIG 49.0 10/19/2017   Lab Results  Component Value Date   CHOLHDL 2 10/19/2017   Lab Results  Component Value Date   PSA 0.35 10/19/2017   PSA 0.44 10/19/2016   PSA 0.27 10/21/2015    Lab Results  Component Value Date   HGBA1C 6.6 (H) 04/11/2018    IMPRESSION AND PLAN:  1) DM 2: historically well controlled. Hb1c today. Feet exam normal today.  2) HTN: The current medical regimen is effective;  continue present plan and medications. Lytes/cr today.  3) Hypercholesterolemia: tolerating statin. FLP today and hepatic panel today.  4) Prostate ca screening: DRE normal.  PSA today. Discussed latest data regarding using ASA for primary prevention-->Decided to d/c ASA qd today.  An After Visit Summary was printed and given to the patient.  FOLLOW UP: Return in about 6 months (around 04/17/2019) for routine chronic illness f/u.  Signed:  Crissie Sickles, MD           10/16/2018

## 2018-10-17 ENCOUNTER — Encounter: Payer: Self-pay | Admitting: *Deleted

## 2018-11-24 ENCOUNTER — Other Ambulatory Visit: Payer: Self-pay | Admitting: Family Medicine

## 2018-12-25 DIAGNOSIS — H2513 Age-related nuclear cataract, bilateral: Secondary | ICD-10-CM | POA: Diagnosis not present

## 2018-12-25 DIAGNOSIS — E119 Type 2 diabetes mellitus without complications: Secondary | ICD-10-CM | POA: Diagnosis not present

## 2018-12-25 DIAGNOSIS — H401131 Primary open-angle glaucoma, bilateral, mild stage: Secondary | ICD-10-CM | POA: Diagnosis not present

## 2018-12-25 DIAGNOSIS — H25013 Cortical age-related cataract, bilateral: Secondary | ICD-10-CM | POA: Diagnosis not present

## 2018-12-25 LAB — HM DIABETES EYE EXAM

## 2018-12-26 ENCOUNTER — Encounter: Payer: Self-pay | Admitting: Family Medicine

## 2019-01-11 ENCOUNTER — Other Ambulatory Visit: Payer: Self-pay | Admitting: Family Medicine

## 2019-02-28 ENCOUNTER — Other Ambulatory Visit: Payer: Self-pay | Admitting: Family Medicine

## 2019-03-27 DIAGNOSIS — H401131 Primary open-angle glaucoma, bilateral, mild stage: Secondary | ICD-10-CM | POA: Diagnosis not present

## 2019-04-18 ENCOUNTER — Encounter: Payer: Self-pay | Admitting: Family Medicine

## 2019-04-18 ENCOUNTER — Ambulatory Visit (INDEPENDENT_AMBULATORY_CARE_PROVIDER_SITE_OTHER): Payer: Medicare Other | Admitting: Family Medicine

## 2019-04-18 ENCOUNTER — Ambulatory Visit: Payer: Medicare Other | Admitting: Family Medicine

## 2019-04-18 ENCOUNTER — Other Ambulatory Visit: Payer: Self-pay

## 2019-04-18 VITALS — BP 97/61 | Wt 189.0 lb

## 2019-04-18 DIAGNOSIS — E7800 Pure hypercholesterolemia, unspecified: Secondary | ICD-10-CM | POA: Diagnosis not present

## 2019-04-18 DIAGNOSIS — E119 Type 2 diabetes mellitus without complications: Secondary | ICD-10-CM | POA: Diagnosis not present

## 2019-04-18 DIAGNOSIS — I10 Essential (primary) hypertension: Secondary | ICD-10-CM | POA: Diagnosis not present

## 2019-04-18 NOTE — Progress Notes (Signed)
Virtual Visit via Video Note  I connected with pt on 04/18/19 at  1:20 PM EDT by a video enabled telemedicine application and verified that I am speaking with the correct person using two identifiers.  Location patient: home Location provider:work or home office Persons participating in the virtual visit: patient, provider  I discussed the limitations of evaluation and management by telemedicine and the availability of in person appointments. The patient expressed understanding and agreed to proceed.  Telemedicine visit is a necessity given the COVID-19 restrictions in place at the current time.  HPI: 68 y/o WM being seen today for 6 mo f/u DM2, HTN, HLD. Doing great, enjoying retirement.  DM:  No home gluc monitoring.  HTN: some home bp measurements lately since he has a home bp cuff now (wrist cuff): these have consistently been 120s/70s, even lower systolic this morning.  Pt w/out orthostatic dizziness and has no fatigue.  HLD: tolerating statin.  Diet is good.  Pt very active at home, at his [place], going to beach.   ROS: See pertinent positives and negatives per HPI.  Past Medical History:  Diagnosis Date  . Degenerative joint disease    C-spine  . Diabetes mellitus 1998   No neuropathy or nephropathy.  Last D.R exam (neg) 11/25/17.  . Glaucoma    Primary open angle OU, mild--travatan Z drops per Dr. Gershon Crane  . Hearing impairment   . Hx of adenomatous colonic polyps 2006; 2011;2016   Colonoscopy 2006+ aden.col. pol.  Rpt 05/14/2010 showed mild diverticulosis and int hem, o/w nl.  Rpt 2016 : adenomatous colon polyp x 1.  . Hypercholesteremia 1999  . Hypertension 1999  . Nephrolithiasis 1990s  . Nuclear sclerotic cataract of both eyes    Dr. Gershon Crane  . Obesity, Class I, BMI 30-34.9 12/22/2011    Past Surgical History:  Procedure Laterality Date  . COLONOSCOPY  2006;6/302011;06/2015   2006-adenomatous polypectomy.  2011--diverticulosis.  No polyps.  2016 adenomatous  polyp x 1.  . CYSTOSCOPY W/ URETEROSCOPY W/ LITHOTRIPSY  04/2012    Family History  Problem Relation Age of Onset  . Diabetes Mother        type 2  . Heart disease Mother        CABG  . Stroke Father        paralyzed on right side  . Diabetes Father        type 2     Current Outpatient Medications:  .  atorvastatin (LIPITOR) 40 MG tablet, TAKE 1 TABLET EVERY DAY, Disp: 90 tablet, Rfl: 1 .  doxazosin (CARDURA) 4 MG tablet, TAKE 1 TABLET AT BEDTIME, Disp: 90 tablet, Rfl: 1 .  Fexofenadine HCl (ALLEGRA PO), Take by mouth. Take 1 daily as needed for allergies., Disp: , Rfl:  .  latanoprost (XALATAN) 0.005 % ophthalmic solution, Place 1 drop into both eyes at bedtime., Disp: , Rfl:  .  Loratadine (CLARITIN PO), Take by mouth. Take 1 daily as needed for allergies., Disp: , Rfl:  .  metFORMIN (GLUCOPHAGE) 500 MG tablet, TAKE 1 TABLET EVERY MORNING AND 2 TABLETS EVERY EVENING, Disp: 270 tablet, Rfl: 1 .  ramipril (ALTACE) 10 MG capsule, TAKE 1 CAPSULE EVERY DAY, Disp: 90 capsule, Rfl: 1 .  aspirin 81 MG tablet, Take 81 mg by mouth daily.  , Disp: , Rfl:   EXAM:  VITALS per patient if applicable: BP 31/51 (BP Location: Left Arm, Patient Position: Sitting, Cuff Size: Normal)   Wt 189 lb (85.7 kg)  BMI 27.71 kg/m    GENERAL: alert, oriented, appears well and in no acute distress  HEENT: atraumatic, conjunttiva clear, no obvious abnormalities on inspection of external nose and ears  NECK: normal movements of the head and neck  LUNGS: on inspection no signs of respiratory distress, breathing rate appears normal, no obvious gross SOB, gasping or wheezing  CV: no obvious cyanosis  MS: moves all visible extremities without noticeable abnormality  PSYCH/NEURO: pleasant and cooperative, no obvious depression or anxiety, speech and thought processing grossly intact  LABS: none today    Chemistry      Component Value Date/Time   NA 136 10/16/2018 0856   K 4.3 10/16/2018 0856   CL  98 10/16/2018 0856   CO2 28 10/16/2018 0856   BUN 15 10/16/2018 0856   CREATININE 0.79 10/16/2018 0856      Component Value Date/Time   CALCIUM 9.8 10/16/2018 0856   ALKPHOS 70 10/16/2018 0856   AST 23 10/16/2018 0856   ALT 23 10/16/2018 0856   BILITOT 0.7 10/16/2018 0856     Lab Results  Component Value Date   CHOL 128 10/16/2018   HDL 52.20 10/16/2018   LDLCALC 59 10/16/2018   TRIG 84.0 10/16/2018   CHOLHDL 2 10/16/2018   Lab Results  Component Value Date   WBC 8.4 08/08/2014   HGB 14.1 08/08/2014   HCT 43.3 08/08/2014   MCV 95.6 08/08/2014   PLT 277.0 08/08/2014   Lab Results  Component Value Date   HGBA1C 6.6 (H) 10/16/2018   Lab Results  Component Value Date   TSH 1.18 08/08/2014   Lab Results  Component Value Date   PSA 0.35 10/16/2018   PSA 0.35 10/19/2017   PSA 0.44 10/19/2016    ASSESSMENT AND PLAN:  Discussed the following assessment and plan:  No diagnosis found.  1) DM, HTN, HLD: all stable. Tolerating all meds, eating well, staying active. No new problems. Pt to call back to make lab appt for A1c, BMET, and urine microalb/cr.   I discussed the assessment and treatment plan with the patient. The patient was provided an opportunity to ask questions and all were answered. The patient agreed with the plan and demonstrated an understanding of the instructions.   The patient was advised to call back or seek an in-person evaluation if the symptoms worsen or if the condition fails to improve as anticipated.  F/u: 6 mo RCI  Signed:  Crissie Sickles, MD           04/18/2019

## 2019-04-18 NOTE — Addendum Note (Signed)
Addended by: Deveron Furlong D on: 04/18/2019 01:45 PM   Modules accepted: Orders

## 2019-05-08 ENCOUNTER — Other Ambulatory Visit: Payer: Self-pay

## 2019-05-08 ENCOUNTER — Ambulatory Visit (INDEPENDENT_AMBULATORY_CARE_PROVIDER_SITE_OTHER): Payer: Medicare Other | Admitting: Family Medicine

## 2019-05-08 DIAGNOSIS — E119 Type 2 diabetes mellitus without complications: Secondary | ICD-10-CM | POA: Diagnosis not present

## 2019-05-08 DIAGNOSIS — I10 Essential (primary) hypertension: Secondary | ICD-10-CM

## 2019-05-08 LAB — MICROALBUMIN / CREATININE URINE RATIO
Creatinine,U: 114.4 mg/dL
Microalb Creat Ratio: 0.9 mg/g (ref 0.0–30.0)
Microalb, Ur: 1 mg/dL (ref 0.0–1.9)

## 2019-05-08 LAB — BASIC METABOLIC PANEL
BUN: 15 mg/dL (ref 6–23)
CO2: 27 mEq/L (ref 19–32)
Calcium: 9.1 mg/dL (ref 8.4–10.5)
Chloride: 98 mEq/L (ref 96–112)
Creatinine, Ser: 0.76 mg/dL (ref 0.40–1.50)
GFR: 101.28 mL/min (ref >60.00)
Glucose, Bld: 159 mg/dL — ABNORMAL HIGH (ref 70–99)
Potassium: 4.5 mEq/L (ref 3.5–5.1)
Sodium: 134 mEq/L — ABNORMAL LOW (ref 135–145)

## 2019-05-08 LAB — HEMOGLOBIN A1C: Hgb A1c MFr Bld: 6.7 % — ABNORMAL HIGH (ref 4.6–6.5)

## 2019-05-09 ENCOUNTER — Telehealth: Payer: Self-pay

## 2019-05-09 NOTE — Telephone Encounter (Signed)
MyChart message sent with results.  

## 2019-05-23 ENCOUNTER — Other Ambulatory Visit: Payer: Self-pay | Admitting: Family Medicine

## 2019-06-28 DIAGNOSIS — H401131 Primary open-angle glaucoma, bilateral, mild stage: Secondary | ICD-10-CM | POA: Diagnosis not present

## 2019-07-17 ENCOUNTER — Encounter: Payer: Self-pay | Admitting: Family Medicine

## 2019-08-02 ENCOUNTER — Other Ambulatory Visit: Payer: Self-pay | Admitting: Family Medicine

## 2019-08-07 ENCOUNTER — Other Ambulatory Visit: Payer: Self-pay

## 2019-08-07 ENCOUNTER — Encounter: Payer: Self-pay | Admitting: Family Medicine

## 2019-08-07 ENCOUNTER — Ambulatory Visit (INDEPENDENT_AMBULATORY_CARE_PROVIDER_SITE_OTHER): Payer: Medicare Other | Admitting: Family Medicine

## 2019-08-07 VITALS — BP 130/85 | HR 59 | Temp 98.3°F | Resp 16 | Ht 69.25 in | Wt 189.8 lb

## 2019-08-07 DIAGNOSIS — Z23 Encounter for immunization: Secondary | ICD-10-CM | POA: Diagnosis not present

## 2019-08-07 DIAGNOSIS — R413 Other amnesia: Secondary | ICD-10-CM | POA: Diagnosis not present

## 2019-08-07 DIAGNOSIS — G3184 Mild cognitive impairment, so stated: Secondary | ICD-10-CM | POA: Diagnosis not present

## 2019-08-07 NOTE — Patient Instructions (Signed)
Stop taking metformin for 1 month.

## 2019-08-07 NOTE — Progress Notes (Signed)
Office Note 08/07/2019  CC:  Chief Complaint  Patient presents with  . trouble with memory    HPI:  Thomas Boyle is a 68 y.o. White male who is here for "trouble with memory". Wife and daughter sent list of symptoms: says long history (about 2 yrs, insidious onset, gradual progression) of memory issues but worse since 06/23/19.  No CVA sx's around the time of worsening.  Things mentioned in the symptom list are: was laid back, now impatient, forgetful more than usual, some conversation quirks such as changing subject abruptly, but he can easily be redirected and carry on with the previous conversation.  He admits to a few of these things, but seems to minimize them and says some are due to his poor hearing.  He took some online mental status screens at the urging of his wife/daughter and he passed all 100% except one which required a bit more concentration than others.  He does not drink or take drugs. His brother Shanon Brow recently died of ALS but Ronalee Belts has dealt with David's gradual decline well and feels like he is dealing with his death well and is happy that he is no longer suffering.   Ronalee Belts denies feeling depressed or significantly anxious. 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 No hallucinations or delusions.  He has never had a TIA or CVA.  No hx of carotid artery dz or arrhythmia.  No hx of head trauma.  No paresthesias, numbness, weakness, dysarthria, swallowing problems, or vision problems.  He has chronic hearing loss, doesn't wear his hearing aids much. No recurrent falls. No shuffling gait, no stooped posture, no masked facies, no tremor or rigidity.   He has no sadness/depressed mood, no desire to isolate, no anhedonia, no SI or HI.  No psychomotor retardation.  He has good appetite and energy level.  He is keeping busy keeping up two homes. His mom did develop dementia around age 6.  His daughter's main concern is the  possibility of metformin causing/contributing to his sx's. He has been on this med for many years.    Past Medical History:  Diagnosis Date  . Degenerative joint disease    C-spine  . Diabetes mellitus 1998   No neuropathy or nephropathy.  Last D.R exam (neg) 11/25/17.  . Glaucoma    Primary open angle OU, mild--travatan Z drops per Dr. Gershon Crane  . Hearing impairment   . Hx of adenomatous colonic polyps 2006; 2011;2016   Colonoscopy 2006+ aden.col. pol.  Rpt 05/14/2010 showed mild diverticulosis and int hem, o/w nl.  Rpt 2016 : adenomatous colon polyp x 1.  . Hypercholesteremia 1999  . Hypertension 1999  . Nephrolithiasis 1990s  . Nuclear sclerotic cataract of both eyes    Dr. Gershon Crane  . Obesity, Class I, BMI 30-34.9 12/22/2011    Past Surgical History:  Procedure Laterality Date  . COLONOSCOPY  2006;6/302011;06/2015   2006-adenomatous polypectomy.  2011--diverticulosis.  No polyps.  2016 adenomatous polyp x 1.  . CYSTOSCOPY W/ URETEROSCOPY W/ LITHOTRIPSY  04/2012    Family History  Problem Relation Age of Onset  . Diabetes Mother        type 2  . Heart disease Mother        CABG  . Stroke Father        paralyzed on right side  . Diabetes Father        type 2    Social History   Socioeconomic History  .  Marital status: Married    Spouse name: Not on file  . Number of children: Not on file  . Years of education: Not on file  . Highest education level: Not on file  Occupational History  . Not on file  Social Needs  . Financial resource strain: Not on file  . Food insecurity    Worry: Not on file    Inability: Not on file  . Transportation needs    Medical: Not on file    Non-medical: Not on file  Tobacco Use  . Smoking status: Former Smoker    Types: Cigarettes    Quit date: 11/15/1977    Years since quitting: 41.7  . Smokeless tobacco: Never Used  Substance and Sexual Activity  . Alcohol use: Yes    Comment: 6 pk a week  . Drug use: No  . Sexual activity:  Yes    Partners: Female  Lifestyle  . Physical activity    Days per week: Not on file    Minutes per session: Not on file  . Stress: Not on file  Relationships  . Social Herbalist on phone: Not on file    Gets together: Not on file    Attends religious service: Not on file    Active member of club or organization: Not on file    Attends meetings of clubs or organizations: Not on file    Relationship status: Not on file  . Intimate partner violence    Fear of current or ex partner: Not on file    Emotionally abused: Not on file    Physically abused: Not on file    Forced sexual activity: Not on file  Other Topics Concern  . Not on file  Social History Narrative   Married, one grown daughter.  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   No formal exercise.  L[place] in [place] in [place].    Outpatient Medications Prior to Visit  Medication Sig Dispense Refill  . atorvastatin (LIPITOR) 40 MG tablet TAKE 1 TABLET EVERY DAY 90 tablet 1  . doxazosin (CARDURA) 4 MG tablet TAKE 1 TABLET AT BEDTIME 90 tablet 1  . latanoprost (XALATAN) 0.005 % ophthalmic solution Place 1 drop into both eyes at bedtime.    . metFORMIN (GLUCOPHAGE) 500 MG tablet TAKE 1 TABLET EVERY MORNING AND 2 TABLETS EVERY EVENING 270 tablet 1  . ramipril (ALTACE) 10 MG capsule TAKE 1 CAPSULE EVERY DAY 90 capsule 1  . Fexofenadine HCl (ALLEGRA PO) Take by mouth. Take 1 daily as needed for allergies.    . Loratadine (CLARITIN PO) Take by mouth. Take 1 daily as needed for allergies.     No facility-administered medications prior to visit.     No Known Allergies  ROS Review of Systems  Constitutional: Negative for appetite change, chills, fatigue and fever.  HENT: Negative for congestion, dental problem, ear pain and sore throat.   Eyes: Negative for discharge, redness and visual disturbance.  Respiratory: Negative for cough, chest tightness, shortness of breath and wheezing.   Cardiovascular:  Negative for chest pain, palpitations and leg swelling.  Gastrointestinal: Negative for abdominal pain, blood in stool, diarrhea, nausea and vomiting.  Genitourinary: Negative for difficulty urinating, dysuria, flank pain, frequency, hematuria and urgency.  Musculoskeletal: Negative for arthralgias, back pain, joint swelling, myalgias and neck stiffness.  Skin: Negative for pallor and rash.  Neurological: Negative for dizziness, speech difficulty, weakness and headaches.  Hematological: Negative for adenopathy. Does not  bruise/bleed easily.  Psychiatric/Behavioral: Positive for confusion (occasionally-see HPI) and decreased concentration. Negative for sleep disturbance and suicidal ideas. The patient is not nervous/anxious and is not hyperactive.     PE; Blood pressure 130/85, pulse (!) 59, temperature 98.3 F (36.8 C), temperature source Temporal, resp. rate 16, height 5' 9.25" (1.759 m), weight 189 lb 12.8 oz (86.1 kg), SpO2 99 %. Gen: Alert, well appearing.  Patient is oriented to person, place, time, and situation. AFFECT: pleasant, lucid thought and speech.  He is HOH. Normal range of facial expressions.   Neck: carotids 2+ bilat, no bruits.  No TM or LAD. [id number]: no injection, icteris, swelling, or exudate.  EOMI, PERRLA. Mouth: lips without lesion/swelling.  Oral mucosa pink and moist. Oropharynx without erythema, exudate, or swelling.  Neck - No masses or thyromegaly or limitation in range of motion CV: RRR, no m/r/g.   LUNGS: CTA bilat, nonlabored resps, good aeration in all lung fields. ABD: soft, NT/ND EXT: no clubbing or cyanosis.  no edema.  Neuro: CN 2-12 intact bilaterally, strength 5/5 in proximal and distal upper extremities and lower extremities bilaterally.  No sensory deficits.  No tremor.  No disdiadochokinesis.  No ataxia.  Upper extremity and lower extremity DTRs symmetric.  No pronator drift. No shuffling when walking.  No rigidity.  Posture is not stooped.  MMSE  here today was 30/30  Pertinent labs:   No results found for: VITAMINB12  Lab Results  Component Value Date   TSH 1.18 08/08/2014   Lab Results  Component Value Date   WBC 8.4 08/08/2014   HGB 14.1 08/08/2014   HCT 43.3 08/08/2014   MCV 95.6 08/08/2014   PLT 277.0 08/08/2014   Lab Results  Component Value Date   CREATININE 0.76 05/08/2019   BUN 15 05/08/2019   NA 134 (L) 05/08/2019   K 4.5 05/08/2019   CL 98 05/08/2019   CO2 27 05/08/2019   Lab Results  Component Value Date   ALT 23 10/16/2018   AST 23 10/16/2018   ALKPHOS 70 10/16/2018   BILITOT 0.7 10/16/2018   Lab Results  Component Value Date   CHOL 128 10/16/2018   Lab Results  Component Value Date   HDL 52.20 10/16/2018   Lab Results  Component Value Date   LDLCALC 59 10/16/2018   Lab Results  Component Value Date   TRIG 84.0 10/16/2018   Lab Results  Component Value Date   CHOLHDL 2 10/16/2018   Lab Results  Component Value Date   PSA 0.35 10/16/2018   PSA 0.35 10/19/2017   PSA 0.44 10/19/2016   Lab Results  Component Value Date   HGBA1C 6.7 (H) 05/08/2019    ASSESSMENT AND PLAN:   1) Mild cognitive impairment with memory loss. I don't think any imaging or lab testing are indicated at this time. He is not depressed. His hearing impairment is definitely playing a role in his problems, and being retired has contributed to a decrease in his mental sharpness. He does seem to minimize his sx's some. I am fine with him staying off of metformin for 1 month and see if any differences/improvements are noted.  If not, I would even give him a month off of atorvastatin, which has also been known to cause some cognitive problems. An After Visit Summary was printed and given to the patient.  FOLLOW UP:  Return in about 4 weeks (around 09/04/2019) for f/u memory -in office.  Signed:  Crissie Sickles, MD  08/07/2019  Spent 30 min with pt today, with >50% of this time spent in counseling and  care coordination regarding the above problems.

## 2019-09-05 ENCOUNTER — Ambulatory Visit (INDEPENDENT_AMBULATORY_CARE_PROVIDER_SITE_OTHER): Payer: Medicare Other | Admitting: Family Medicine

## 2019-09-05 ENCOUNTER — Encounter: Payer: Self-pay | Admitting: Family Medicine

## 2019-09-05 ENCOUNTER — Other Ambulatory Visit: Payer: Self-pay

## 2019-09-05 VITALS — BP 118/70 | HR 66 | Temp 98.2°F | Resp 16 | Ht 69.25 in | Wt 199.2 lb

## 2019-09-05 DIAGNOSIS — R413 Other amnesia: Secondary | ICD-10-CM

## 2019-09-05 NOTE — Progress Notes (Signed)
OFFICE VISIT  09/05/2019   CC:  Chief Complaint  Patient presents with  . Follow-up    memory loss   HPI:    Patient is a 68 y.o. Caucasian male who presents for 1 mo f/u memory loss. A/P as of last visit: "Mild cognitive impairment with memory loss. I don't think any imaging or lab testing are indicated at this time. He is not depressed. His hearing impairment is definitely playing a role in his problems, and being retired has contributed to a decrease in his mental sharpness. He does seem to minimize his sx's some. I am fine with him staying off of metformin for 1 month and see if any differences/improvements are noted.  If not, I would even give him a month off of atorvastatin, which has also been known to cause some cognitive problems.".  Interim hx: He cannot tell any difference in anything.  Family has not commented on any difference in his recall or tendency to be more impatient and irritable (particularly when he gets a comment about his memory). Wt is up a little, about 10 lbs in 1 mo.  Says diet/caloric intake not changed, not much change in activity habits.  No new sx's.  Past Medical History:  Diagnosis Date  . Degenerative joint disease    C-spine  . Diabetes mellitus 1998   No neuropathy or nephropathy.  Last D.R exam (neg) 11/25/17.  . Glaucoma    Primary open angle OU, mild--travatan Z drops per Dr. Gershon Crane  . Hearing impairment   . Hx of adenomatous colonic polyps 2006; 2011;2016   Colonoscopy 2006+ aden.col. pol.  Rpt 05/14/2010 showed mild diverticulosis and int hem, o/w nl.  Rpt 2016 : adenomatous colon polyp x 1.  . Hypercholesteremia 1999  . Hypertension 1999  . Nephrolithiasis 1990s  . Nuclear sclerotic cataract of both eyes    Dr. Gershon Crane  . Obesity, Class I, BMI 30-34.9 12/22/2011    Past Surgical History:  Procedure Laterality Date  . COLONOSCOPY  2006;6/302011;06/2015   2006-adenomatous polypectomy.  2011--diverticulosis.  No polyps.  2016  adenomatous polyp x 1.  . CYSTOSCOPY W/ URETEROSCOPY W/ LITHOTRIPSY  04/2012    Outpatient Medications Prior to Visit  Medication Sig Dispense Refill  . atorvastatin (LIPITOR) 40 MG tablet TAKE 1 TABLET EVERY DAY 90 tablet 1  . doxazosin (CARDURA) 4 MG tablet TAKE 1 TABLET AT BEDTIME 90 tablet 1  . Fexofenadine HCl (ALLEGRA PO) Take by mouth. Take 1 daily as needed for allergies.    Marland Kitchen latanoprost (XALATAN) 0.005 % ophthalmic solution Place 1 drop into both eyes at bedtime.    . Loratadine (CLARITIN PO) Take by mouth. Take 1 daily as needed for allergies.    . ramipril (ALTACE) 10 MG capsule TAKE 1 CAPSULE EVERY DAY 90 capsule 1  . metFORMIN (GLUCOPHAGE) 500 MG tablet TAKE 1 TABLET EVERY MORNING AND 2 TABLETS EVERY EVENING (Patient not taking: Reported on 09/05/2019) 270 tablet 1   No facility-administered medications prior to visit.     No Known Allergies  ROS As per HPI  PE: Blood pressure 118/70, pulse 66, temperature 98.2 F (36.8 C), temperature source Temporal, resp. rate 16, height 5' 9.25" (1.759 m), weight 199 lb 3.2 oz (90.4 kg), SpO2 98 %. Gen: Alert, well appearing.  Patient is oriented to person, place, time, and situation. AFFECT: pleasant, lucid thought and speech. No further exam today.  LABS:  Lab Results  Component Value Date   HGBA1C 6.7 (H) 05/08/2019  Chemistry      Component Value Date/Time   NA 134 (L) 05/08/2019 0917   K 4.5 05/08/2019 0917   CL 98 05/08/2019 0917   CO2 27 05/08/2019 0917   BUN 15 05/08/2019 0917   CREATININE 0.76 05/08/2019 0917      Component Value Date/Time   CALCIUM 9.1 05/08/2019 0917   ALKPHOS 70 10/16/2018 0856   AST 23 10/16/2018 0856   ALT 23 10/16/2018 0856   BILITOT 0.7 10/16/2018 0856     Lab Results  Component Value Date   CHOL 128 10/16/2018   HDL 52.20 10/16/2018   LDLCALC 59 10/16/2018   TRIG 84.0 10/16/2018   CHOLHDL 2 10/16/2018   Lab Results  Component Value Date   WBC 8.4 08/08/2014   HGB 14.1  08/08/2014   HCT 43.3 08/08/2014   MCV 95.6 08/08/2014   PLT 277.0 08/08/2014   Lab Results  Component Value Date   PSA 0.35 10/16/2018   PSA 0.35 10/19/2017   PSA 0.44 10/19/2016     IMPRESSION AND PLAN:  1) Memory impairment, mild, of gradual onset.  No change with taking 1 mo off metformin. Restart metformin. Hold atorva x 2 mo and see if memory improves.  2) Wt gain: 10 lbs over 1 mo. No signif change in diet/exercise.   Monitor at this time.  An After Visit Summary was printed and given to the patient.  Spent 15 min with pt today, with >50% of this time spent in counseling and care coordination regarding the above problems.  FOLLOW UP: Return in about 2 months (around 11/05/2019) for routine chronic illness f/u+ f/u memory.   Next "routine" f/u is due 2 mo.  Signed:  Crissie Sickles, MD           09/05/2019

## 2019-10-01 DIAGNOSIS — H401131 Primary open-angle glaucoma, bilateral, mild stage: Secondary | ICD-10-CM | POA: Diagnosis not present

## 2019-10-23 ENCOUNTER — Other Ambulatory Visit: Payer: Self-pay | Admitting: Family Medicine

## 2019-11-02 ENCOUNTER — Other Ambulatory Visit: Payer: Self-pay

## 2019-11-05 ENCOUNTER — Other Ambulatory Visit: Payer: Self-pay

## 2019-11-05 ENCOUNTER — Ambulatory Visit (INDEPENDENT_AMBULATORY_CARE_PROVIDER_SITE_OTHER): Payer: Medicare Other | Admitting: Family Medicine

## 2019-11-05 ENCOUNTER — Encounter: Payer: Self-pay | Admitting: Family Medicine

## 2019-11-05 VITALS — BP 152/96 | HR 62 | Temp 98.4°F | Resp 16 | Ht 69.25 in | Wt 195.2 lb

## 2019-11-05 DIAGNOSIS — E119 Type 2 diabetes mellitus without complications: Secondary | ICD-10-CM | POA: Diagnosis not present

## 2019-11-05 DIAGNOSIS — Z125 Encounter for screening for malignant neoplasm of prostate: Secondary | ICD-10-CM | POA: Diagnosis not present

## 2019-11-05 DIAGNOSIS — I10 Essential (primary) hypertension: Secondary | ICD-10-CM

## 2019-11-05 DIAGNOSIS — K219 Gastro-esophageal reflux disease without esophagitis: Secondary | ICD-10-CM

## 2019-11-05 DIAGNOSIS — E7800 Pure hypercholesterolemia, unspecified: Secondary | ICD-10-CM

## 2019-11-05 DIAGNOSIS — R413 Other amnesia: Secondary | ICD-10-CM | POA: Diagnosis not present

## 2019-11-05 LAB — COMPREHENSIVE METABOLIC PANEL
ALT: 18 U/L (ref 0–53)
AST: 22 U/L (ref 0–37)
Albumin: 3.9 g/dL (ref 3.5–5.2)
Alkaline Phosphatase: 66 U/L (ref 39–117)
BUN: 10 mg/dL (ref 6–23)
CO2: 25 mEq/L (ref 19–32)
Calcium: 9.3 mg/dL (ref 8.4–10.5)
Chloride: 100 mEq/L (ref 96–112)
Creatinine, Ser: 0.76 mg/dL (ref 0.40–1.50)
GFR: 101.14 mL/min (ref >60.00)
Glucose, Bld: 102 mg/dL — ABNORMAL HIGH (ref 70–99)
Potassium: 4.2 mEq/L (ref 3.5–5.1)
Sodium: 137 mEq/L (ref 135–145)
Total Bilirubin: 0.6 mg/dL (ref 0.2–1.2)
Total Protein: 7.1 g/dL (ref 6.0–8.3)

## 2019-11-05 LAB — LIPID PANEL
Cholesterol: 157 mg/dL (ref 0–200)
HDL: 55.8 mg/dL (ref >39.00)
LDL Cholesterol: 89 mg/dL (ref 0–99)
NonHDL: 101.35
Total CHOL/HDL Ratio: 3
Triglycerides: 64 mg/dL (ref 0.0–149.0)
VLDL: 12.8 mg/dL (ref 0.0–40.0)

## 2019-11-05 LAB — PSA, MEDICARE: PSA: 0.27 ng/ml (ref 0.10–4.00)

## 2019-11-05 LAB — HEMOGLOBIN A1C: Hgb A1c MFr Bld: 6.1 % (ref 4.6–6.5)

## 2019-11-05 MED ORDER — HYDROCHLOROTHIAZIDE 25 MG PO TABS: 25.0000 mg | ORAL_TABLET | Freq: Every day | ORAL | 1 refills | Status: DC

## 2019-11-05 MED ORDER — FAMOTIDINE 40 MG PO TABS: 40.0000 mg | ORAL_TABLET | Freq: Two times a day (BID) | ORAL | 2 refills | Status: DC

## 2019-11-05 NOTE — Progress Notes (Signed)
OFFICE VISIT  11/05/2019   CC:  Chief Complaint  Patient presents with  . Follow-up    RCI and memory issues, pt is fasting   HPI:    Patient is a 68 y.o. Caucasian male who presents for 2 mo f/u mild memory impairment. A/P as of last follow up visit: ) Memory impairment, mild, of gradual onset.  No change with taking 1 mo off metformin. Restart metformin. Hold atorva x 2 mo and see if memory improves.  2) Wt gain: 10 lbs over 1 mo. No signif change in diet/exercise.   Monitor at this time."  Interim hx: He notes no changes in memory.  He is not worried much about his memory.  Notes no functional impairment from this.  Did family Zoom meeting recently and had no problem. His wife says he is too forgetful and seems to not pay attention to her like he used to. He admits to not wearing his hearing aids like he should sometimes.  No depression or anxiety.  Does note substernal burning, burping, worse postprandially, worse in afternoon.  Lying supine makes it a bit worse.  No exertional pain.  He does vigorous yard work for hours.  No tums or rolaids lately.  Happening 3-4 days a week.    His wt is down 4 lbs.  DM: no home glucose monitoring.  No polyuria or polydipsia. Eating fairly good diabetic diet.    HTN: has wrist cuff occ but he doesn't recall any specific probs.  ROS: no CP, no SOB, no wheezing, no cough, no dizziness, no HAs, no rashes, no melena/hematochezia.  No polyuria or polydipsia.  No myalgias or arthralgias.   Past Medical History:  Diagnosis Date  . Degenerative joint disease    C-spine  . Diabetes mellitus 1998   No neuropathy or nephropathy.  Last D.R exam (neg) 11/25/17.  . Glaucoma    Primary open angle OU, mild--travatan Z drops per Dr. Gershon Crane  . Hearing impairment   . Hx of adenomatous colonic polyps 2006; 2011;2016   Colonoscopy 2006+ aden.col. pol.  Rpt 05/14/2010 showed mild diverticulosis and int hem, o/w nl.  Rpt 2016 : adenomatous colon polyp  x 1.  . Hypercholesteremia 1999  . Hypertension 1999  . Nephrolithiasis 1990s  . Nuclear sclerotic cataract of both eyes    Dr. Gershon Crane  . Obesity, Class I, BMI 30-34.9 12/22/2011    Past Surgical History:  Procedure Laterality Date  . COLONOSCOPY  2006;6/302011;06/2015   2006-adenomatous polypectomy.  2011--diverticulosis.  No polyps.  2016 adenomatous polyp x 1.  . CYSTOSCOPY W/ URETEROSCOPY W/ LITHOTRIPSY  04/2012    Outpatient Medications Prior to Visit  Medication Sig Dispense Refill  . doxazosin (CARDURA) 4 MG tablet TAKE 1 TABLET AT BEDTIME 90 tablet 1  . Fexofenadine HCl (ALLEGRA PO) Take by mouth. Take 1 daily as needed for allergies.    Marland Kitchen latanoprost (XALATAN) 0.005 % ophthalmic solution Place 1 drop into both eyes at bedtime.    . Loratadine (CLARITIN PO) Take by mouth. Take 1 daily as needed for allergies.    . ramipril (ALTACE) 10 MG capsule TAKE 1 CAPSULE EVERY DAY 90 capsule 1  . atorvastatin (LIPITOR) 40 MG tablet TAKE 1 TABLET EVERY DAY (Patient not taking: Reported on 11/05/2019) 90 tablet 1   No facility-administered medications prior to visit.    No Known Allergies  ROS As per HPI  PE: Rpeat manual bp at end of visit was 144/84. Blood pressure (!) 152/96,  pulse 62, temperature 98.4 F (36.9 C), temperature source Temporal, resp. rate 16, height 5' 9.25" (1.759 m), weight 195 lb 3.2 oz (88.5 kg), SpO2 97 %. Body mass index is 28.62 kg/m.  Gen: Alert, well appearing.  Patient is oriented to person, place, time, and situation. AFFECT: pleasant, lucid thought and speech. No further exam today.  LABS:    Chemistry      Component Value Date/Time   NA 134 (L) 05/08/2019 0917   K 4.5 05/08/2019 0917   CL 98 05/08/2019 0917   CO2 27 05/08/2019 0917   BUN 15 05/08/2019 0917   CREATININE 0.76 05/08/2019 0917      Component Value Date/Time   CALCIUM 9.1 05/08/2019 0917   ALKPHOS 70 10/16/2018 0856   AST 23 10/16/2018 0856   ALT 23 10/16/2018 0856    BILITOT 0.7 10/16/2018 0856     Lab Results  Component Value Date   HGBA1C 6.7 (H) 05/08/2019   Lab Results  Component Value Date   CHOL 128 10/16/2018   HDL 52.20 10/16/2018   LDLCALC 59 10/16/2018   TRIG 84.0 10/16/2018   CHOLHDL 2 10/16/2018   Lab Results  Component Value Date   WBC 8.4 08/08/2014   HGB 14.1 08/08/2014   HCT 43.3 08/08/2014   MCV 95.6 08/08/2014   PLT 277.0 08/08/2014   Lab Results  Component Value Date   PSA 0.35 10/16/2018   PSA 0.35 10/19/2017   PSA 0.44 10/19/2016     IMPRESSION AND PLAN:  1) Memory impairment w/out functional impairment.   No change/improvement with trials of metformin and statin. Watchful waiting approach at this time.  2) GERD: start pepcid 40mg  bid.  If not helpful then will start pantoprazole 40mg  qd.  3) HTN: not ideal control.  Continue ramipril 10mg  qd, add hctz 25mg  qd. BMET today.  4) DM 2: control has historically been very good. Hba1c and fasting glucose today.  5) HLD: restart statin (he has been off this med for 1 mo). FLP and hepatic panel today.  6) Prostate cancer screening: PSA today.  An After Visit Summary was printed and given to the patient.  FOLLOW UP: 4 wks f/u telemed  Signed:  Crissie Sickles, MD           11/05/2019

## 2019-11-05 NOTE — Patient Instructions (Signed)
Restart your atorvastatin (your cholesterol medication).  Continue taking one 10 mg tab of ramipril and I have also sent in a new bp med called hydrochlorothiazide to take 1 tab a day as well.

## 2019-12-06 ENCOUNTER — Encounter: Payer: Self-pay | Admitting: Family Medicine

## 2019-12-06 ENCOUNTER — Other Ambulatory Visit: Payer: Self-pay

## 2019-12-06 ENCOUNTER — Ambulatory Visit (INDEPENDENT_AMBULATORY_CARE_PROVIDER_SITE_OTHER): Payer: Medicare Other | Admitting: Family Medicine

## 2019-12-06 VITALS — BP 110/75 | HR 61 | Temp 98.0°F | Resp 16 | Ht 69.25 in | Wt 189.2 lb

## 2019-12-06 DIAGNOSIS — I10 Essential (primary) hypertension: Secondary | ICD-10-CM

## 2019-12-06 LAB — BASIC METABOLIC PANEL
BUN: 12 mg/dL (ref 6–23)
CO2: 30 mEq/L (ref 19–32)
Calcium: 9.7 mg/dL (ref 8.4–10.5)
Chloride: 100 mEq/L (ref 96–112)
Creatinine, Ser: 0.79 mg/dL (ref 0.40–1.50)
GFR: 96.7 mL/min (ref >60.00)
Glucose, Bld: 107 mg/dL — ABNORMAL HIGH (ref 70–99)
Potassium: 4.7 mEq/L (ref 3.5–5.1)
Sodium: 138 mEq/L (ref 135–145)

## 2019-12-06 NOTE — Progress Notes (Signed)
OFFICE VISIT  12/06/2019   CC:  Chief Complaint  Patient presents with  . Follow-up    hypertension, pt is fasting     HPI:    Patient is a 69 y.o. Caucasian male who presents for 1 mo f/u HTN. 69 y/o WM being seen today for 1 mo f/u HTN. A/P as of last visit: "HTN: not ideal control.  Continue ramipril 10mg  qd, add hctz 25mg  qd. BMET today."  Interim hx: Feeling well. Home bp's consistently <130/80.   No change in urinary frequency/volume.  Past Medical History:  Diagnosis Date  . Degenerative joint disease    C-spine  . Diabetes mellitus 1998   No neuropathy or nephropathy.  Last D.R exam (neg) 11/25/17.  . Glaucoma    Primary open angle OU, mild--travatan Z drops per Dr. Gershon Crane  . Hearing impairment   . Hx of adenomatous colonic polyps 2006; 2011;2016   Colonoscopy 2006+ aden.col. pol.  Rpt 05/14/2010 showed mild diverticulosis and int hem, o/w nl.  Rpt 2016 : adenomatous colon polyp x 1.  . Hypercholesteremia 1999  . Hypertension 1999  . Nephrolithiasis 1990s  . Nuclear sclerotic cataract of both eyes    Dr. Gershon Crane  . Obesity, Class I, BMI 30-34.9 12/22/2011    Past Surgical History:  Procedure Laterality Date  . COLONOSCOPY  2006;6/302011;06/2015   2006-adenomatous polypectomy.  2011--diverticulosis.  No polyps.  2016 adenomatous polyp x 1.  . CYSTOSCOPY W/ URETEROSCOPY W/ LITHOTRIPSY  04/2012    Outpatient Medications Prior to Visit  Medication Sig Dispense Refill  . atorvastatin (LIPITOR) 40 MG tablet TAKE 1 TABLET EVERY DAY 90 tablet 1  . doxazosin (CARDURA) 4 MG tablet TAKE 1 TABLET AT BEDTIME 90 tablet 1  . famotidine (PEPCID) 40 MG tablet Take 1 tablet (40 mg total) by mouth 2 (two) times daily. 60 tablet 2  . Fexofenadine HCl (ALLEGRA PO) Take by mouth. Take 1 daily as needed for allergies.    . hydrochlorothiazide (HYDRODIURIL) 25 MG tablet Take 1 tablet (25 mg total) by mouth daily. 30 tablet 1  . latanoprost (XALATAN) 0.005 % ophthalmic solution  Place 1 drop into both eyes at bedtime.    . Loratadine (CLARITIN PO) Take by mouth. Take 1 daily as needed for allergies.    . metFORMIN (GLUCOPHAGE) 500 MG tablet Take 500 mg by mouth 3 (three) times daily. Patient takes 1 tablet by mouth in the morning and 2 tablets at night.    . ramipril (ALTACE) 10 MG capsule TAKE 1 CAPSULE EVERY DAY 90 capsule 1   No facility-administered medications prior to visit.    No Known Allergies  ROS As per HPI  PE: Blood pressure 110/75, pulse 61, temperature 98 F (36.7 C), temperature source Temporal, resp. rate 16, height 5' 9.25" (1.759 m), weight 189 lb 3.2 oz (85.8 kg), SpO2 97 %. Gen: Alert, well appearing.  Patient is oriented to person, place, time, and situation. AFFECT: pleasant, lucid thought and speech. CV: RRR, occ ectopic beat, no m/r/g.   LUNGS: CTA bilat, nonlabored resps, good aeration in all lung fields. EXT: no clubbing or cyanosis.  no edema.    LABS:    Chemistry      Component Value Date/Time   NA 137 11/05/2019 1010   K 4.2 11/05/2019 1010   CL 100 11/05/2019 1010   CO2 25 11/05/2019 1010   BUN 10 11/05/2019 1010   CREATININE 0.76 11/05/2019 1010      Component Value Date/Time  CALCIUM 9.3 11/05/2019 1010   ALKPHOS 66 11/05/2019 1010   AST 22 11/05/2019 1010   ALT 18 11/05/2019 1010   BILITOT 0.6 11/05/2019 1010      IMPRESSION AND PLAN:  HTN; well controlled now. The current medical regimen is effective;  continue present plan and medications. BMET today.  An After Visit Summary was printed and given to the patient.  FOLLOW UP: Return in about 5 months (around 05/05/2020) for routine chronic illness f/u.  Signed:  Crissie Sickles, MD           12/06/2019

## 2019-12-30 ENCOUNTER — Other Ambulatory Visit: Payer: Self-pay | Admitting: Family Medicine

## 2020-01-01 DIAGNOSIS — H401131 Primary open-angle glaucoma, bilateral, mild stage: Secondary | ICD-10-CM | POA: Diagnosis not present

## 2020-01-01 DIAGNOSIS — E1136 Type 2 diabetes mellitus with diabetic cataract: Secondary | ICD-10-CM | POA: Diagnosis not present

## 2020-01-01 DIAGNOSIS — H2513 Age-related nuclear cataract, bilateral: Secondary | ICD-10-CM | POA: Diagnosis not present

## 2020-01-01 LAB — HM DIABETES EYE EXAM

## 2020-01-02 ENCOUNTER — Encounter: Payer: Self-pay | Admitting: Family Medicine

## 2020-01-29 ENCOUNTER — Other Ambulatory Visit: Payer: Self-pay | Admitting: Family Medicine

## 2020-02-14 ENCOUNTER — Other Ambulatory Visit: Payer: Self-pay | Admitting: Family Medicine

## 2020-02-19 ENCOUNTER — Other Ambulatory Visit: Payer: Self-pay

## 2020-02-19 MED ORDER — FAMOTIDINE 40 MG PO TABS: 40.0000 mg | ORAL_TABLET | Freq: Two times a day (BID) | ORAL | 2 refills | Status: AC

## 2020-02-19 MED ORDER — HYDROCHLOROTHIAZIDE 25 MG PO TABS: 25.0000 mg | ORAL_TABLET | Freq: Every day | ORAL | 0 refills | Status: DC

## 2020-03-26 ENCOUNTER — Other Ambulatory Visit: Payer: Self-pay | Admitting: Family Medicine

## 2020-04-28 DIAGNOSIS — H401131 Primary open-angle glaucoma, bilateral, mild stage: Secondary | ICD-10-CM | POA: Diagnosis not present

## 2020-05-15 DIAGNOSIS — M109 Gout, unspecified: Secondary | ICD-10-CM

## 2020-05-15 HISTORY — DX: Gout, unspecified: M10.9

## 2020-05-17 ENCOUNTER — Other Ambulatory Visit: Payer: Self-pay | Admitting: Family Medicine

## 2020-05-20 ENCOUNTER — Telehealth: Payer: Self-pay

## 2020-05-20 ENCOUNTER — Other Ambulatory Visit: Payer: Self-pay

## 2020-05-20 MED ORDER — ATORVASTATIN CALCIUM 40 MG PO TABS: 40.0000 mg | ORAL_TABLET | Freq: Every day | ORAL | 0 refills | Status: DC

## 2020-05-20 MED ORDER — DOXAZOSIN MESYLATE 4 MG PO TABS: 4.0000 mg | ORAL_TABLET | Freq: Every day | ORAL | 0 refills | Status: DC

## 2020-05-20 MED ORDER — RAMIPRIL 10 MG PO CAPS: 10.0000 mg | ORAL_CAPSULE | Freq: Every day | ORAL | 0 refills | Status: DC

## 2020-05-20 MED ORDER — METFORMIN HCL 500 MG PO TABS: ORAL_TABLET | ORAL | 0 refills | Status: DC

## 2020-05-20 NOTE — Telephone Encounter (Signed)
Patient called because his prescriptions were denied I have him scheduled for 05/26/20 just wanting to see if he can get the scripts faxed in now that he is scheduled    Please advise

## 2020-05-20 NOTE — Telephone Encounter (Signed)
RF's sent, mychart message sent notifying patient of this.

## 2020-05-22 ENCOUNTER — Ambulatory Visit: Payer: Medicare Other | Admitting: Family Medicine

## 2020-05-22 NOTE — Telephone Encounter (Signed)
Patient called, he said that his pharmacy does not have his prescriptions that he called and spoke to someone about on Tuesday. Patient stated that he told he told employee on Tuesday ALL were to go to [place].  After review, they were sent to [facility] instead.  Patient has an appt scheduled for 7/12 with Dr. Anitra Lauth.  ALL prescriptions are to be sent to [facility] at 90 d/s.  Patient should have enough until his visit.  EXCEPT for his Atorvastastin He will have to pick up that prescription ONLY at [facility], all others can be returned.  Atorvastastin 30 d/s is ready for pick up.  He is  Requesting a 90 d/s to placed on FILE at [facility] until after this 30 d/s fill. He doesn't have enough meds to wait until it fixed.  doxazosin (CARDURA) 4 MG tablet [[id number] metFORMIN (GLUCOPHAGE) 500 MG tablet [[id number]] ramipril (ALTACE) 10 MG capsule [[id number]]  atorvastatin (LIPITOR) 40 MG tablet [[id number]]   [place]  They must be returned to [facility] at [facility] before sending to [facility].  Thank you!

## 2020-05-22 NOTE — Telephone Encounter (Signed)
RF cancelled for all meds except Atorvastatin. Pt has enough until appt on 7/12.

## 2020-05-23 NOTE — Telephone Encounter (Signed)
Patient called to confirm prescriptions were cancelled. Patient is to pick up 30 d/s At[facility]vastatin at [facility] this weekend ONLY. All other refill requests for meds should have been cancelled.  After appt on Monday 7/12 with Dr. Anitra Lauth  90 d/s refills will be sent to [facility].  Patient understood and thanked me.

## 2020-05-26 ENCOUNTER — Other Ambulatory Visit: Payer: Self-pay

## 2020-05-26 ENCOUNTER — Ambulatory Visit (INDEPENDENT_AMBULATORY_CARE_PROVIDER_SITE_OTHER): Payer: Medicare Other | Admitting: Family Medicine

## 2020-05-26 ENCOUNTER — Encounter: Payer: Self-pay | Admitting: Family Medicine

## 2020-05-26 VITALS — BP 95/62 | HR 82 | Temp 98.2°F | Resp 16 | Ht 69.25 in | Wt 188.6 lb

## 2020-05-26 DIAGNOSIS — I9589 Other hypotension: Secondary | ICD-10-CM

## 2020-05-26 DIAGNOSIS — E7800 Pure hypercholesterolemia, unspecified: Secondary | ICD-10-CM | POA: Diagnosis not present

## 2020-05-26 DIAGNOSIS — E119 Type 2 diabetes mellitus without complications: Secondary | ICD-10-CM

## 2020-05-26 DIAGNOSIS — I10 Essential (primary) hypertension: Secondary | ICD-10-CM | POA: Diagnosis not present

## 2020-05-26 DIAGNOSIS — M10071 Idiopathic gout, right ankle and foot: Secondary | ICD-10-CM

## 2020-05-26 DIAGNOSIS — E861 Hypovolemia: Secondary | ICD-10-CM

## 2020-05-26 LAB — BASIC METABOLIC PANEL
BUN: 19 mg/dL (ref 6–23)
CO2: 29 mEq/L (ref 19–32)
Calcium: 9.5 mg/dL (ref 8.4–10.5)
Chloride: 100 mEq/L (ref 96–112)
Creatinine, Ser: 1.03 mg/dL (ref 0.40–1.50)
GFR: 71.1 mL/min (ref >60.00)
Glucose, Bld: 135 mg/dL — ABNORMAL HIGH (ref 70–99)
Potassium: 4.8 mEq/L (ref 3.5–5.1)
Sodium: 137 mEq/L (ref 135–145)

## 2020-05-26 LAB — URIC ACID: Uric Acid, Serum: 10.6 mg/dL — ABNORMAL HIGH (ref 4.0–7.8)

## 2020-05-26 LAB — MICROALBUMIN / CREATININE URINE RATIO
Creatinine,U: 197.3 mg/dL
Microalb Creat Ratio: 0.7 mg/g (ref 0.0–30.0)
Microalb, Ur: 1.4 mg/dL (ref 0.0–1.9)

## 2020-05-26 LAB — HEMOGLOBIN A1C: Hgb A1c MFr Bld: 6.9 % — ABNORMAL HIGH (ref 4.6–6.5)

## 2020-05-26 MED ORDER — METFORMIN HCL 500 MG PO TABS: ORAL_TABLET | ORAL | 1 refills | Status: DC

## 2020-05-26 MED ORDER — RAMIPRIL 10 MG PO CAPS: 10.0000 mg | ORAL_CAPSULE | Freq: Every day | ORAL | 1 refills | Status: DC

## 2020-05-26 MED ORDER — INDOMETHACIN 50 MG PO CAPS: ORAL_CAPSULE | ORAL | 3 refills | Status: AC

## 2020-05-26 MED ORDER — HYDROCHLOROTHIAZIDE 25 MG PO TABS: 25.0000 mg | ORAL_TABLET | Freq: Every day | ORAL | 1 refills | Status: DC

## 2020-05-26 MED ORDER — ATORVASTATIN CALCIUM 40 MG PO TABS: 40.0000 mg | ORAL_TABLET | Freq: Every day | ORAL | 1 refills | Status: DC

## 2020-05-26 MED ORDER — DOXAZOSIN MESYLATE 4 MG PO TABS: 4.0000 mg | ORAL_TABLET | Freq: Every day | ORAL | 1 refills | Status: DC

## 2020-05-26 NOTE — Progress Notes (Signed)
OFFICE VISIT  05/26/2020   CC:  Chief Complaint  Patient presents with  . Follow-up    RCI, pt is not fasting    HPI:    Patient is a 69 y.o. Caucasian male who presents for f/u DM, HTN, HLD.  DM: good diet, compliant with metformin. Feet->no numbness, tingling, or burning.   HTN: occ home bp check 120-130/75-85. He mowed this morning, not hydrating as well as usual.  HLD: tolerating statin.  Onset of pain in R MTP joint 2 d upon waking up, had not overused it or injured it recently. Mild swelling.  4-5/10 intensity, sometimes has to limp b/c of pain.  It has swelled up a little and feels a little warm.  No hx of gout.    Past Medical History:  Diagnosis Date  . Degenerative joint disease    C-spine  . Diabetes mellitus 1998   No neuropathy or nephropathy.  Last D.R exam (neg) 11/25/17.  . Glaucoma    Primary open angle OU, mild--travatan Z drops per Dr. Gershon Crane  . Hearing impairment   . Hx of adenomatous colonic polyps 2006; 2011;2016   Colonoscopy 2006+ aden.col. pol.  Rpt 05/14/2010 showed mild diverticulosis and int hem, o/w nl.  Rpt 2016 : adenomatous colon polyp x 1.  . Hypercholesteremia 1999  . Hypertension 1999  . Nephrolithiasis 1990s  . Nuclear sclerotic cataract of both eyes    Dr. Gershon Crane  . Obesity, Class I, BMI 30-34.9 12/22/2011    Past Surgical History:  Procedure Laterality Date  . COLONOSCOPY  2006;6/302011;06/2015   2006-adenomatous polypectomy.  2011--diverticulosis.  No polyps.  2016 adenomatous polyp x 1.  . CYSTOSCOPY W/ URETEROSCOPY W/ LITHOTRIPSY  04/2012    Outpatient Medications Prior to Visit  Medication Sig Dispense Refill  . famotidine (PEPCID) 40 MG tablet Take 1 tablet (40 mg total) by mouth 2 (two) times daily. 60 tablet 2  . Fexofenadine HCl (ALLEGRA PO) Take by mouth. Take 1 daily as needed for allergies.    Marland Kitchen latanoprost (XALATAN) 0.005 % ophthalmic solution Place 1 drop into both eyes at bedtime.    . Loratadine (CLARITIN PO)  Take by mouth. Take 1 daily as needed for allergies.    Marland Kitchen atorvastatin (LIPITOR) 40 MG tablet Take 1 tablet (40 mg total) by mouth daily. 30 tablet 0  . doxazosin (CARDURA) 4 MG tablet Take 1 tablet (4 mg total) by mouth at bedtime. 30 tablet 0  . hydrochlorothiazide (HYDRODIURIL) 25 MG tablet TAKE 1 TABLET BY MOUTH EVERY DAY 90 tablet 0  . metFORMIN (GLUCOPHAGE) 500 MG tablet TAKE 1 TABLET EVERY MORNING AND 2 TABLETS EVERY EVENING 90 tablet 0  . ramipril (ALTACE) 10 MG capsule Take 1 capsule (10 mg total) by mouth daily. 30 capsule 0   No facility-administered medications prior to visit.    No Known Allergies  ROS As per HPI  PE: Vitals with BMI 05/26/2020 12/06/2019 11/05/2019  Height 5' 9.25" 5' 9.25" 5' 9.25"  Weight 188 lbs 10 oz 189 lbs 3 oz 195 lbs 3 oz  BMI 27.65 62.95 28.41  Systolic 95 324 401  Diastolic 62 75 96  Pulse 82 61 62  O2 sat 97% on RA today  Gen: Alert, well appearing.  Patient is oriented to person, place, time, and situation. AFFECT: pleasant, lucid thought and speech. Foot exam - ; No skin or vascular lesions. Color and temperature is normal. Sensation is intact. Peripheral pulses are palpable. Toenails are normal. R MTP  joint tender, warm, mild swelling and mild dec ROM from pain. No erythema.  LABS:  Lab Results  Component Value Date   TSH 1.18 08/08/2014   Lab Results  Component Value Date   WBC 8.4 08/08/2014   HGB 14.1 08/08/2014   HCT 43.3 08/08/2014   MCV 95.6 08/08/2014   PLT 277.0 08/08/2014   Lab Results  Component Value Date   CREATININE 0.79 12/06/2019   BUN 12 12/06/2019   NA 138 12/06/2019   K 4.7 12/06/2019   CL 100 12/06/2019   CO2 30 12/06/2019   Lab Results  Component Value Date   ALT 18 11/05/2019   AST 22 11/05/2019   ALKPHOS 66 11/05/2019   BILITOT 0.6 11/05/2019   Lab Results  Component Value Date   CHOL 157 11/05/2019   Lab Results  Component Value Date   HDL 55.80 11/05/2019   Lab Results  Component  Value Date   LDLCALC 89 11/05/2019   Lab Results  Component Value Date   TRIG 64.0 11/05/2019   Lab Results  Component Value Date   CHOLHDL 3 11/05/2019   Lab Results  Component Value Date   PSA 0.27 11/05/2019   PSA 0.35 10/16/2018   PSA 0.35 10/19/2017   Lab Results  Component Value Date   HGBA1C 6.1 11/05/2019    IMPRESSION AND PLAN:  1) Gouty arthritis: 1st episode. Discussed dx, tx at this time for acute flare with indocin 50mg  tid prn with food. Uric acid level, lytes/cr today. Low purine diet handout given.  2) DM 2, historically well controlled. BMET, HbA1c today.   Feet exam normal other than gouty R MTP joint. Urine microalb/cr today.  3) HTN: a bit low today but he feels well and it is typically higher on home bp checks and here in office. He has not hydrated well today +worked out in the heat. No changes, encouraged him to monitor bp at home more and call if consistently <110 syst or <60 diast and we'll cut back on meds.  4) HLD: tolerating statin. Next lipid/hepatic panel check in 6 mo.  An After Visit Summary was printed and given to the patient.  FOLLOW UP: Return in about 6 months (around 11/26/2020) for routine chronic illness f/u.  Signed:  Crissie Sickles, MD           05/26/2020

## 2020-05-26 NOTE — Patient Instructions (Signed)

## 2020-05-27 ENCOUNTER — Other Ambulatory Visit: Payer: Self-pay

## 2020-05-27 ENCOUNTER — Encounter: Payer: Self-pay | Admitting: Family Medicine

## 2020-05-27 MED ORDER — METFORMIN HCL 500 MG PO TABS: ORAL_TABLET | ORAL | 1 refills | Status: DC

## 2020-06-17 ENCOUNTER — Other Ambulatory Visit: Payer: Self-pay | Admitting: Family Medicine

## 2020-07-29 DIAGNOSIS — H401131 Primary open-angle glaucoma, bilateral, mild stage: Secondary | ICD-10-CM | POA: Diagnosis not present

## 2020-10-28 DIAGNOSIS — H401131 Primary open-angle glaucoma, bilateral, mild stage: Secondary | ICD-10-CM | POA: Diagnosis not present

## 2020-11-11 NOTE — Progress Notes (Signed)
Subjective:   Thomas Boyle is a 69 y.o. male who presents for Medicare Annual/Subsequent preventive examination.  I connected with Thomas Boyle today by telephone and verified that I am speaking with the correct person using two identifiers. Location patient: home Location provider: work Persons participating in the virtual visit: patient, Engineer, civil (consulting).    I discussed the limitations, risks, security and privacy concerns of performing an evaluation and management service by telephone and the availability of in person appointments. I also discussed with the patient that there may be a patient responsible charge related to this service. The patient expressed understanding and verbally consented to this telephonic visit.    Interactive audio and video telecommunications were attempted between this provider and patient, however failed, due to patient having technical difficulties OR patient did not have access to video capability.  We continued and completed visit with audio only.  Some vital signs may be absent or patient reported.   Time Spent with patient on telephone encounter: 20 minutes   Review of Systems     Cardiac Risk Factors include: advanced age (>23men, >68 women);male gender;diabetes mellitus;dyslipidemia;hypertension     Objective:    Today's Vitals   11/12/20 0945  Weight: 188 lb (85.3 kg)  Height: 5\' 9"  (1.753 m)   Body mass index is 27.76 kg/m.  Advanced Directives 11/12/2020  Does Patient Have a Medical Advance Directive? Yes  Type of 11/14/2020 of [place];Living will  Copy of Healthcare Power of Attorney in Chart? No - copy requested    Current Medications (verified) Outpatient Encounter Medications as of 11/12/2020  Medication Sig  . atorvastatin (LIPITOR) 40 MG tablet Take 1 tablet (40 mg total) by mouth daily.  11/14/2020 doxazosin (CARDURA) 4 MG tablet Take 1 tablet (4 mg total) by mouth at bedtime.  . famotidine (PEPCID) 40 MG tablet Take 1  tablet (40 mg total) by mouth 2 (two) times daily.  Marland Kitchen Fexofenadine HCl (ALLEGRA PO) Take by mouth. Take 1 daily as needed for allergies.  . hydrochlorothiazide (HYDRODIURIL) 25 MG tablet Take 1 tablet (25 mg total) by mouth daily.  . indomethacin (INDOCIN) 50 MG capsule 1 tab po tid prn gout flare  . latanoprost (XALATAN) 0.005 % ophthalmic solution Place 1 drop into both eyes at bedtime.  . Loratadine (CLARITIN PO) Take by mouth. Take 1 daily as needed for allergies.  . metFORMIN (GLUCOPHAGE) 500 MG tablet TAKE 2 TABLETS EVERY MORNING AND 2 TABLETS EVERY EVENING  . Misc Natural Products (NEURIVA PO) Take by mouth daily.  . ramipril (ALTACE) 10 MG capsule Take 1 capsule (10 mg total) by mouth daily.   No facility-administered encounter medications on file as of 11/12/2020.    Allergies (verified) Patient has no known allergies.   History: Past Medical History:  Diagnosis Date  . Degenerative joint disease    C-spine  . Diabetes mellitus 1998   No neuropathy or nephropathy.  Last D.R exam (neg) 11/25/17.  . Glaucoma    Primary open angle OU, mild--travatan Z drops per Dr. 01/23/18  . Gout 05/2020   First flare 05/2020 R MTP joint->uric acid 10.6 at that time.  06/2020 Hearing impairment   . Hx of adenomatous colonic polyps 2006; 2011;2016   Colonoscopy 2006+ aden.col. pol.  Rpt 05/14/2010 showed mild diverticulosis and int hem, o/w nl.  Rpt 2016 : adenomatous colon polyp x 1.  . Hypercholesteremia 1999  . Hypertension 1999  . Nephrolithiasis 1990s  . Nuclear sclerotic cataract of both eyes  Dr. Gershon Crane  . Obesity, Class I, BMI 30-34.9 12/22/2011   Past Surgical History:  Procedure Laterality Date  . COLONOSCOPY  2006;6/302011;06/2015   2006-adenomatous polypectomy.  2011--diverticulosis.  No polyps.  2016 adenomatous polyp x 1.  . CYSTOSCOPY W/ URETEROSCOPY W/ LITHOTRIPSY  04/2012   Family History  Problem Relation Age of Onset  . Diabetes Mother        type 2  . Heart disease  Mother        CABG  . Stroke Father        paralyzed on right side  . Diabetes Father        type 2   Social History   Socioeconomic History  . Marital status: Married    Spouse name: Not on file  . Number of children: Not on file  . Years of education: Not on file  . Highest education level: Not on file  Occupational History  . Not on file  Tobacco Use  . Smoking status: Former Smoker    Types: Cigarettes    Quit date: 11/15/1977    Years since quitting: 43.0  . Smokeless tobacco: Never Used  Substance and Sexual Activity  . Alcohol use: Yes    Comment: 6 pk a week  . Drug use: No  . Sexual activity: Yes    Partners: Female  Other Topics Concern  . Not on file  Social History Narrative   Married, one grown daughter.     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   No formal exercise.     L[place] in [place] in [place].   No T/A/Ds.   Social Determinants of Health   Financial Resource Strain: Low Risk   . Difficulty of Paying Living Expenses: Not hard at all  Food Insecurity: No Food Insecurity  . Worried About Charity fundraiser in the Last Year: Never true  . Ran Out of Food in the Last Year: Never true  Transportation Needs: No Transportation Needs  . Lack of Transportation (Medical): No  . Lack of Transportation (Non-Medical): No  Physical Activity: Inactive  . Days of Exercise per Week: 0 days  . Minutes of Exercise per Session: 0 min  Stress: No Stress Concern Present  . Feeling of Stress : Not at all  Social Connections: Socially Integrated  . Frequency of Communication with Friends and Family: More than three times a week  . Frequency of Social Gatherings with Friends and Family: More than three times a week  . Attends Religious Services: 1 to 4 times per year  . Active Member of Clubs or Organizations: Yes  . Attends Archivist Meetings: 1 to 4 times per year  . Marital Status: Married    Tobacco  Counseling Counseling given: Not Answered   Clinical Intake:  Pre-visit preparation completed: Yes  Pain : No/denies pain     Nutritional Status: BMI 25 -29 Overweight Nutritional Risks: None Diabetes: Yes CBG done?: No Did pt. bring in CBG monitor from home?: No (phone visit)  How often do you need to have someone help you when you read instructions, pamphlets, or other written materials from your doctor or pharmacy?: 1 - Never What is the last grade level you completed in school?: College  Diabetes:  Is the patient diabetic?  Yes  If diabetic, was a CBG obtained today?  No  Did the patient bring in their glucometer from home?  No phone visit How often do you monitor  your CBG's? occasionally  Financial Strains and Diabetes Management:  Are you having any financial strains with the device, your supplies or your medication? No .  Does the patient want to be seen by Chronic Care Management for management of their diabetes?  No  Would the patient like to be referred to a Nutritionist or for Diabetic Management?  No   Diabetic Exams:  Diabetic Eye Exam: Completed 01/01/2020.   Diabetic Foot Exam: Completed 05/26/2020.   Interpreter Needed?: No  Information entered by :: Caroleen Hamman LPN   Activities of Daily Living In your present state of health, do you have any difficulty performing the following activities: 11/12/2020 05/26/2020  Hearing? Y Y  Comment hearing loss usually wears hearing aides  Vision? N N  Difficulty concentrating or making decisions? N N  Walking or climbing stairs? N N  Dressing or bathing? N N  Doing errands, shopping? N N  Preparing Food and eating ? N -  Using the Toilet? N -  In the past six months, have you accidently leaked urine? N -  Do you have problems with loss of bowel control? N -  Managing your Medications? N -  Managing your Finances? N -  Housekeeping or managing your Housekeeping? N -  Some recent data might be hidden     Patient Care Team: Tammi Sou, MD as PCP - General (Family Medicine) Richmond Campbell, MD as Consulting Physician (Gastroenterology) Rutherford Guys, MD as Consulting Physician (Ophthalmology) Pamala Hurry, MD as Consulting Physician (Urology)  Indicate any recent Medical Services you may have received from other than [facility] providers in the past year (date may be approximate).     Assessment:   This is a routine wellness examination for Tung.  Hearing/Vision screen  Hearing Screening   125Hz  250Hz  500Hz  1000Hz  2000Hz  3000Hz  4000Hz  6000Hz  8000Hz   Right ear:           Left ear:           Comments: Hearing aids-wears occasionally  Vision Screening Comments: Reading glasses only Last eye  exam -10/2020-  Dr. Gershon Crane  Dietary issues and exercise activities discussed: Current Exercise Habits: The patient does not participate in regular exercise at present, Exercise limited by: None identified  Goals    . Patient Stated     Drink more water      Depression Screen PHQ 2/9 Scores 11/12/2020 05/26/2020 04/18/2019 10/16/2018 10/19/2017 10/21/2015 08/06/2014  PHQ - 2 Score 0 0 0 0 0 0 0    Fall Risk Fall Risk  11/12/2020 05/26/2020 04/18/2019 10/16/2018 10/19/2017  Falls in the past year? 0 0 0 0 No  Comment - - - - -  Number falls in past yr: 0 0 0 - -  Injury with Fall? 0 0 0 - -  Follow up Falls prevention discussed Falls evaluation completed Falls evaluation completed - -    FALL RISK PREVENTION PERTAINING TO THE HOME:  Any stairs in or around the home? Yes  If so, are there any without handrails? No  Home free of loose throw rugs in walkways, pet beds, electrical cords, etc? Yes  Adequate lighting in your home to reduce risk of falls? Yes   ASSISTIVE DEVICES UTILIZED TO PREVENT FALLS:  Life alert? No  Use of a cane, walker or w/c? No  Grab bars in the bathroom? Yes  Shower chair or bench in shower? No  Elevated toilet seat or a handicapped toilet? No   TIMED UP  AND GO:  Was the test performed? No . Phone visit   Cognitive Function:Normal cognitive status assessed by  this Nurse Health Advisor. No abnormalities found.          Immunizations Immunization History  Administered Date(s) Administered  . Fluad Quad(high Dose 65+) 08/07/2019  . Influenza Split 08/18/2011, 09/01/2012, 09/08/2017  . Influenza, High Dose Seasonal PF 10/21/2015, 10/19/2016, 09/22/2018  . Influenza,inj,Quad PF,6+ Mos 01/18/2014, 08/06/2014  . Pneumococcal Conjugate-13 01/18/2014  . Pneumococcal Polysaccharide-23 11/15/2001, 03/28/2015  . Tdap 04/21/2011  . Zoster 08/21/2012  . Zoster Recombinat (Shingrix) 12/22/2017, 03/22/2018    TDAP status: Up to date  Flu Vaccine status: Declined, Education has been provided regarding the importance of this vaccine but patient still declined. Advised may receive this vaccine at local pharmacy or Health Dept. Aware to provide a copy of the vaccination record if obtained from local pharmacy or Health Dept. Verbalized acceptance and understanding.  Pneumococcal vaccine status: Up to date  Covid-19 vaccine status: Declined, Education has been provided regarding the importance of this vaccine but patient still declined. Advised may receive this vaccine at local pharmacy or Health Dept.or vaccine clinic. Aware to provide a copy of the vaccination record if obtained from local pharmacy or Health Dept. Verbalized acceptance and understanding.  Qualifies for Shingles Vaccine? No   Zostavax completed Yes   Shingrix Completed?: Yes  Screening Tests Health Maintenance  Topic Date Due  . COVID-19 Vaccine (1) Never done  . INFLUENZA VACCINE  06/15/2020  . HEMOGLOBIN A1C  11/26/2020  . OPHTHALMOLOGY EXAM  12/31/2020  . TETANUS/TDAP  04/20/2021  . FOOT EXAM  05/26/2021  . COLONOSCOPY (Pts 45-32yrs Insurance coverage will need to be confirmed)  06/23/2025  . Hepatitis C Screening  Completed  . PNA vac Low Risk Adult  Completed     Health Maintenance  Health Maintenance Due  Topic Date Due  . COVID-19 Vaccine (1) Never done  . INFLUENZA VACCINE  06/15/2020    Colorectal cancer screening: Type of screening: Colonoscopy. Completed 06/24/2015. Repeat every 10 years  Lung Cancer Screening: (Low Dose CT Chest recommended if Age 38-80 years, 30 pack-year currently smoking OR have quit w/in 15years.) does not qualify.     Additional Screening:  Hepatitis C Screening:  Completed 04/19/2017  Vision Screening: Recommended annual ophthalmology exams for early detection of glaucoma and other disorders of the eye. Is the patient up to date with their annual eye exam?  Yes  Who is the provider or what is the name of the office in which the patient attends annual eye exams? Dr. Gershon Crane    Dental Screening: Recommended annual dental exams for proper oral hygiene  Community Resource Referral / Chronic Care Management: CRR required this visit?  No   CCM required this visit?  No      Plan:     I have personally reviewed and noted the following in the patient's chart:   . Medical and social history . Use of alcohol, tobacco or illicit drugs  . Current medications and supplements . Functional ability and status . Nutritional status . Physical activity . Advanced directives . List of other physicians . Hospitalizations, surgeries, and ER visits in previous 12 months . Vitals . Screenings to include cognitive, depression, and falls . Referrals and appointments  In addition, I have reviewed and discussed with patient certain preventive protocols, quality metrics, and best practice recommendations. A written personalized care plan for preventive services as well as general preventive health recommendations  were provided to patient.   Due to this being a telephonic visit, the after visit summary with patients personalized plan was offered to patient via mail or my-chart. Patient would like to access on  my-chart.   Marta Antu, LPN   [id number]  Nurse Healh Advisor  Nurse Notes: None

## 2020-11-12 ENCOUNTER — Ambulatory Visit (INDEPENDENT_AMBULATORY_CARE_PROVIDER_SITE_OTHER): Payer: Medicare Other

## 2020-11-12 VITALS — Ht 69.0 in | Wt 188.0 lb

## 2020-11-12 DIAGNOSIS — Z0000 Encounter for general adult medical examination without abnormal findings: Secondary | ICD-10-CM

## 2020-11-12 NOTE — Patient Instructions (Signed)
Thomas Boyle , Thank you for taking time to complete your Medicare Wellness Visit. I appreciate your ongoing commitment to your health goals. Please review the following plan we discussed and let me know if I can assist you in the future.   Screening recommendations/referrals: Colonoscopy: Completed 06/24/2015-Due 06/23/2025 Recommended yearly ophthalmology/optometry visit for glaucoma screening and checkup Recommended yearly dental visit for hygiene and checkup  Vaccinations: Influenza vaccine:Declined  Pneumococcal vaccine: Completed vaccines Tdap vaccine: Up to date-Due 04/20/2021 Shingles vaccine: Completed vaccines   Covid-19: Declined  Advanced directives: Please bring a copy for your chart  Conditions/risks identified: See problem list  Next appointment: Follow up in one year for your annual wellness visit.   Preventive Care 85 Years and Older, Male Preventive care refers to lifestyle choices and visits with your health care provider that can promote health and wellness. What does preventive care include?  A yearly physical exam. This is also called an annual well check.  Dental exams once or twice a year.  Routine eye exams. Ask your health care provider how often you should have your eyes checked.  Personal lifestyle choices, including:  Daily care of your teeth and gums.  Regular physical activity.  Eating a healthy diet.  Avoiding tobacco and drug use.  Limiting alcohol use.  Practicing safe sex.  Taking low doses of aspirin every day.  Taking vitamin and mineral supplements as recommended by your health care provider. What happens during an annual well check? The services and screenings done by your health care provider during your annual well check will depend on your age, overall health, lifestyle risk factors, and family history of disease. Counseling  Your health care provider may ask you questions about your:  Alcohol use.  Tobacco use.  Drug  use.  Emotional well-being.  Home and relationship well-being.  Sexual activity.  Eating habits.  History of falls.  Memory and ability to understand (cognition).  Work and work Astronomer. Screening  You may have the following tests or measurements:  Height, weight, and BMI.  Blood pressure.  Lipid and cholesterol levels. These may be checked every 5 years, or more frequently if you are over 23 years old.  Skin check.  Lung cancer screening. You may have this screening every year starting at age 29 if you have a 30-pack-year history of smoking and currently smoke or have quit within the past 15 years.  Fecal occult blood test (FOBT) of the stool. You may have this test every year starting at age 57.  Flexible sigmoidoscopy or colonoscopy. You may have a sigmoidoscopy every 5 years or a colonoscopy every 10 years starting at age 44.  Prostate cancer screening. Recommendations will vary depending on your family history and other risks.  Hepatitis C blood test.  Hepatitis B blood test.  Sexually transmitted disease (STD) testing.  Diabetes screening. This is done by checking your blood sugar (glucose) after you have not eaten for a while (fasting). You may have this done every 1-3 years.  Abdominal aortic aneurysm (AAA) screening. You may need this if you are a current or former smoker.  Osteoporosis. You may be screened starting at age 70 if you are at high risk. Talk with your health care provider about your test results, treatment options, and if necessary, the need for more tests. Vaccines  Your health care provider may recommend certain vaccines, such as:  Influenza vaccine. This is recommended every year.  Tetanus, diphtheria, and acellular pertussis (Tdap, Td) vaccine. You  may need a Td booster every 10 years.  Zoster vaccine. You may need this after age 33.  Pneumococcal 13-valent conjugate (PCV13) vaccine. One dose is recommended after age  42.  Pneumococcal polysaccharide (PPSV23) vaccine. One dose is recommended after age 59. Talk to your health care provider about which screenings and vaccines you need and how often you need them. This information is not intended to replace advice given to you by your health care provider. Make sure you discuss any questions you have with your health care provider. Document Released: 11/28/2015 Document Revised: 07/21/2016 Document Reviewed: 09/02/2015 Elsevier Interactive Patient Education  2017 [place] Prevention in the Home Falls can cause injuries. They can happen to people of all ages. There are many things you can do to make your home safe and to help prevent falls. What can I do on the outside of my home?  Regularly fix the edges of walkways and driveways and fix any cracks.  Remove anything that might make you trip as you walk through a door, such as a raised step or threshold.  Trim any bushes or trees on the path to your home.  Use bright outdoor lighting.  Clear any walking paths of anything that might make someone trip, such as rocks or tools.  Regularly check to see if handrails are loose or broken. Make sure that both sides of any steps have handrails.  Any raised decks and porches should have guardrails on the edges.  Have any leaves, snow, or ice cleared regularly.  Use sand or salt on walking paths during winter.  Clean up any spills in your garage right away. This includes oil or grease spills. What can I do in the bathroom?  Use night lights.  Install grab bars by the toilet and in the tub and shower. Do not use towel bars as grab bars.  Use non-skid mats or decals in the tub or shower.  If you need to sit down in the shower, use a plastic, non-slip stool.  Keep the floor dry. Clean up any water that spills on the floor as soon as it happens.  Remove soap buildup in the tub or shower regularly.  Attach bath mats securely with double-sided  non-slip rug tape.  Do not have throw rugs and other things on the floor that can make you trip. What can I do in the bedroom?  Use night lights.  Make sure that you have a light by your bed that is easy to reach.  Do not use any sheets or blankets that are too big for your bed. They should not hang down onto the floor.  Have a firm chair that has side arms. You can use this for support while you get dressed.  Do not have throw rugs and other things on the floor that can make you trip. What can I do in the kitchen?  Clean up any spills right away.  Avoid walking on wet floors.  Keep items that you use a lot in easy-to-reach places.  If you need to reach something above you, use a strong step stool that has a grab bar.  Keep electrical cords out of the way.  Do not use floor polish or wax that makes floors slippery. If you must use wax, use non-skid floor wax.  Do not have throw rugs and other things on the floor that can make you trip. What can I do with my stairs?  Do not leave any items  on the stairs.  Make sure that there are handrails on both sides of the stairs and use them. Fix handrails that are broken or loose. Make sure that handrails are as long as the stairways.  Check any carpeting to make sure that it is firmly attached to the stairs. Fix any carpet that is loose or worn.  Avoid having throw rugs at the top or bottom of the stairs. If you do have throw rugs, attach them to the floor with carpet tape.  Make sure that you have a light switch at the top of the stairs and the bottom of the stairs. If you do not have them, ask someone to add them for you. What else can I do to help prevent falls?  Wear shoes that:  Do not have high heels.  Have rubber bottoms.  Are comfortable and fit you well.  Are closed at the toe. Do not wear sandals.  If you use a stepladder:  Make sure that it is fully opened. Do not climb a closed stepladder.  Make sure that both  sides of the stepladder are locked into place.  Ask someone to hold it for you, if possible.  Clearly mark and make sure that you can see:  Any grab bars or handrails.  First and last steps.  Where the edge of each step is.  Use tools that help you move around (mobility aids) if they are needed. These include:  Canes.  Walkers.  Scooters.  Crutches.  Turn on the lights when you go into a dark area. Replace any light bulbs as soon as they burn out.  Set up your furniture so you have a clear path. Avoid moving your furniture around.  If any of your floors are uneven, fix them.  If there are any pets around you, be aware of where they are.  Review your medicines with your doctor. Some medicines can make you feel dizzy. This can increase your chance of falling. Ask your doctor what other things that you can do to help prevent falls. This information is not intended to replace advice given to you by your health care provider. Make sure you discuss any questions you have with your health care provider. Document Released: 08/28/2009 Document Revised: 04/08/2016 Document Reviewed: 12/06/2014 Elsevier Interactive Patient Education  2017 [employer].

## 2020-11-19 ENCOUNTER — Other Ambulatory Visit: Payer: Self-pay

## 2020-11-20 ENCOUNTER — Ambulatory Visit (INDEPENDENT_AMBULATORY_CARE_PROVIDER_SITE_OTHER): Payer: Medicare Other | Admitting: Family Medicine

## 2020-11-20 ENCOUNTER — Encounter: Payer: Self-pay | Admitting: Family Medicine

## 2020-11-20 VITALS — BP 122/72 | HR 56 | Temp 97.9°F | Resp 16 | Ht 69.25 in | Wt 191.2 lb

## 2020-11-20 DIAGNOSIS — Z125 Encounter for screening for malignant neoplasm of prostate: Secondary | ICD-10-CM | POA: Diagnosis not present

## 2020-11-20 DIAGNOSIS — E7800 Pure hypercholesterolemia, unspecified: Secondary | ICD-10-CM | POA: Diagnosis not present

## 2020-11-20 DIAGNOSIS — M1A0710 Idiopathic chronic gout, right ankle and foot, without tophus (tophi): Secondary | ICD-10-CM

## 2020-11-20 DIAGNOSIS — E119 Type 2 diabetes mellitus without complications: Secondary | ICD-10-CM | POA: Diagnosis not present

## 2020-11-20 DIAGNOSIS — I10 Essential (primary) hypertension: Secondary | ICD-10-CM

## 2020-11-20 LAB — COMPREHENSIVE METABOLIC PANEL
ALT: 19 U/L (ref 0–53)
AST: 23 U/L (ref 0–37)
Albumin: 4.6 g/dL (ref 3.5–5.2)
Alkaline Phosphatase: 72 U/L (ref 39–117)
BUN: 17 mg/dL (ref 6–23)
CO2: 30 mEq/L (ref 19–32)
Calcium: 9.9 mg/dL (ref 8.4–10.5)
Chloride: 99 mEq/L (ref 96–112)
Creatinine, Ser: 0.98 mg/dL (ref 0.40–1.50)
GFR: 77.36 mL/min (ref >60.00)
Glucose, Bld: 106 mg/dL — ABNORMAL HIGH (ref 70–99)
Potassium: 4.4 mEq/L (ref 3.5–5.1)
Sodium: 136 mEq/L (ref 135–145)
Total Bilirubin: 0.6 mg/dL (ref 0.2–1.2)
Total Protein: 7.9 g/dL (ref 6.0–8.3)

## 2020-11-20 LAB — LIPID PANEL
Cholesterol: 126 mg/dL (ref 0–200)
HDL: 55.7 mg/dL (ref >39.00)
LDL Cholesterol: 54 mg/dL (ref 0–99)
NonHDL: 70.46
Total CHOL/HDL Ratio: 2
Triglycerides: 81 mg/dL (ref 0.0–149.0)
VLDL: 16.2 mg/dL (ref 0.0–40.0)

## 2020-11-20 LAB — PSA, MEDICARE: PSA: 0.3 ng/ml (ref 0.10–4.00)

## 2020-11-20 LAB — HEMOGLOBIN A1C: Hgb A1c MFr Bld: 6.8 % — ABNORMAL HIGH (ref 4.6–6.5)

## 2020-11-20 MED ORDER — ALLOPURINOL 100 MG PO TABS
ORAL_TABLET | ORAL | 0 refills | Status: DC
Start: 2020-11-20 — End: 2020-12-14

## 2020-11-20 NOTE — Progress Notes (Signed)
OFFICE VISIT  11/20/2020  CC:  Chief Complaint  Patient presents with  . Follow-up    RCI, 6 mo. Pt is fasting.    HPI:    Patient is a 70 y.o. Caucasian male who presents for 6 mo f/u DM 2, HTN, HLD. A/P as of last visit: "1) Gouty arthritis: 1st episode. Discussed dx, tx at this time for acute flare with indocin 50mg  tid prn with food. Uric acid level, lytes/cr today. Low purine diet handout given.  2) DM 2, historically well controlled. BMET, HbA1c today.   Feet exam normal other than gouty R MTP joint. Urine microalb/cr today.  3) HTN: a bit low today but he feels well and it is typically higher on home bp checks and here in office. He has not hydrated well today +worked out in the heat. No changes, encouraged him to monitor bp at home more and call if consistently <110 syst or <60 diast and we'll cut back on meds.  4) HLD: tolerating statin. Next lipid/hepatic panel check in 6 mo."  INTERIM HX: Feeling fine other than having probs with R great toe still--intermittent pain, swelling, redness c/w gout. Has had to take indocin on several occasions-> several doses a day for 4-5d day stretch.  He did not get the message about increasing his metformin, has still been taking 500mg  qAM, 1000 mg qPM.  No home glucoses. Occ home bp check: 130/80.  Compliant with atarva 40mg  qd w/out problems.   ROS: no fevers, no CP, no SOB, no wheezing, no cough, no dizziness, no HAs, no rashes, no melena/hematochezia.  No polyuria or polydipsia.  No myalgias or arthralgias.  No focal weakness, paresthesias, or tremors.  No acute vision or hearing abnormalities. No n/v/d or abd pain.  No palpitations.   Past Medical History:  Diagnosis Date  . Degenerative joint disease    C-spine  . Diabetes mellitus 1998   No neuropathy or nephropathy.  Last D.R exam (neg) 11/25/17.  . Glaucoma    Primary open angle OU, mild--travatan Z drops per Dr.  . Gout 05/2020   First flare 05/2020 R  MTP joint->uric acid 10.6 at that time.  [place] Hearing impairment   . Hx of adenomatous colonic polyps 2006; 2011;2016   Colonoscopy 2006+ aden.col. pol.  Rpt 05/14/2010 showed mild diverticulosis and int hem, o/w nl.  Rpt 2016 : adenomatous colon polyp x 1.  . Hypercholesteremia 1999  . Hypertension 1999  . Nephrolithiasis 1990s  . Nuclear sclerotic cataract of both eyes    Dr. 05-03-1981  . Obesity, Class I, BMI 30-34.9 12/22/2011    Past Surgical History:  Procedure Laterality Date  . COLONOSCOPY  2006;6/302011;06/2015   2006-adenomatous polypectomy.  2011--diverticulosis.  No polyps.  2016 adenomatous polyp x 1.  . CYSTOSCOPY W/ URETEROSCOPY W/ LITHOTRIPSY  04/2012    Outpatient Medications Prior to Visit  Medication Sig Dispense Refill  . atorvastatin (LIPITOR) 40 MG tablet Take 1 tablet (40 mg total) by mouth daily. 90 tablet 1  . doxazosin (CARDURA) 4 MG tablet Take 1 tablet (4 mg total) by mouth at bedtime. 90 tablet 1  . famotidine (PEPCID) 40 MG tablet Take 1 tablet (40 mg total) by mouth 2 (two) times daily. 60 tablet 2  . Fexofenadine HCl (ALLEGRA PO) Take by mouth. Take 1 daily as needed for allergies.    . hydrochlorothiazide (HYDRODIURIL) 25 MG tablet Take 1 tablet (25 mg total) by mouth daily. 90 tablet 1  . latanoprost (  XALATAN) 0.005 % ophthalmic solution Place 1 drop into both eyes at bedtime.    . Loratadine (CLARITIN PO) Take by mouth. Take 1 daily as needed for allergies.    . metFORMIN (GLUCOPHAGE) 500 MG tablet TAKE 2 TABLETS EVERY MORNING AND 2 TABLETS EVERY EVENING 270 tablet 1  . Misc Natural Products (NEURIVA PO) Take by mouth daily.    . ramipril (ALTACE) 10 MG capsule Take 1 capsule (10 mg total) by mouth daily. 90 capsule 1  . indomethacin (INDOCIN) 50 MG capsule 1 tab po tid prn gout flare (Patient not taking: Reported on 11/20/2020) 30 capsule 3   No facility-administered medications prior to visit.    No Known Allergies  ROS As per HPI  PE: Vitals with  BMI 11/20/2020 11/12/2020 05/26/2020  Height 5' 9.25" 5\' 9"  5' 9.25"  Weight 191 lbs 3 oz 188 lbs 188 lbs 10 oz  BMI 28.03 [id number] [id number]  Systolic 123XX123 - 95  Diastolic 72 - 62  Pulse 56 - 82     Gen: Alert, well appearing.  Patient is oriented to person, place, time, and situation. AFFECT: pleasant, lucid thought and speech. CV: RRR, no m/r/g.   LUNGS: CTA bilat, nonlabored resps, good aeration in all lung fields. EXT: no clubbing or cyanosis.  no edema.  R foot MTP without erythema, swelling, or tenderness.  ROM intact.  No tophi.  LABS:  Lab Results  Component Value Date   TSH 1.18 08/08/2014   Lab Results  Component Value Date   WBC 8.4 08/08/2014   HGB 14.1 08/08/2014   HCT 43.3 08/08/2014   MCV 95.6 08/08/2014   PLT 277.0 08/08/2014   Lab Results  Component Value Date   CREATININE 1.03 05/26/2020   BUN 19 05/26/2020   NA 137 05/26/2020   K 4.8 05/26/2020   CL 100 05/26/2020   CO2 29 05/26/2020   Lab Results  Component Value Date   ALT 18 11/05/2019   AST 22 11/05/2019   ALKPHOS 66 11/05/2019   BILITOT 0.6 11/05/2019   Lab Results  Component Value Date   CHOL 157 11/05/2019   Lab Results  Component Value Date   HDL 55.80 11/05/2019   Lab Results  Component Value Date   LDLCALC 89 11/05/2019   Lab Results  Component Value Date   TRIG 64.0 11/05/2019   Lab Results  Component Value Date   CHOLHDL 3 11/05/2019   Lab Results  Component Value Date   PSA 0.27 11/05/2019   PSA 0.35 10/16/2018   PSA 0.35 10/19/2017   Lab Results  Component Value Date   HGBA1C 6.9 (H) 05/26/2020   Lab Results  Component Value Date   LABURIC 10.6 (H) 05/26/2020   IMPRESSION AND PLAN:  1) Recurrent gouty arthritis, R MTP. Uric acid was 10.6 six mo ago. Given his recurrent attacks/need for indocin I will start allopurinol->100mg  qd x 10d then increase to 2 tabs qd if tolerates.  Ok to continue prn indocin use. He lives 2 hours away now so as long as his renal  function remains good on today's check I feel it is fine to wait until next f/u in 6 mo to recheck labs. If he tolerates the med for the next month then he'll call for RF and i'll do 90d supply to mail order.  2) DM 2, hx of good control. A1c today. If a1c same or higher then we'll have to at least inc metformin as we had planned on  doing last visit.  3) HTN: good control.  Cont ramipril, hctz, and cardura. Lytes/cr today.  4) HLD: tolerating atorva 40mg  qd. FLP and hepatic panel today.  5) Preventative health care:  Covid->pt declined. Flu->pt declined. Colon ca screening: due for repeat colonoscopy and is trying to get set up with a GI provider near [place], where he now lives. Prostate ca screening: PSA today.  An After Visit Summary was printed and given to the patient.  FOLLOW UP: No follow-ups on file.  Signed:  Crissie Sickles, MD           11/20/2020

## 2020-11-26 ENCOUNTER — Ambulatory Visit: Payer: Medicare Other | Admitting: Family Medicine

## 2020-12-08 ENCOUNTER — Other Ambulatory Visit: Payer: Self-pay | Admitting: Family Medicine

## 2020-12-12 ENCOUNTER — Other Ambulatory Visit: Payer: Self-pay | Admitting: Family Medicine

## 2021-04-23 ENCOUNTER — Encounter: Payer: Self-pay | Admitting: Family Medicine

## 2021-04-23 DIAGNOSIS — R4189 Other symptoms and signs involving cognitive functions and awareness: Secondary | ICD-10-CM

## 2021-04-23 DIAGNOSIS — R413 Other amnesia: Secondary | ICD-10-CM

## 2021-04-23 NOTE — Telephone Encounter (Signed)
Please Advise. Pt was last seen 11/2020

## 2021-04-23 NOTE — Telephone Encounter (Signed)
OK, referral ordered 

## 2021-05-28 ENCOUNTER — Ambulatory Visit: Payer: Medicare Other | Admitting: Family Medicine

## 2021-06-08 ENCOUNTER — Other Ambulatory Visit: Payer: Self-pay | Admitting: Family Medicine

## 2021-11-25 ENCOUNTER — Telehealth: Payer: Self-pay | Admitting: Family Medicine

## 2021-11-25 NOTE — Telephone Encounter (Signed)
Spoke with patient to scheduled AWV he stated that he moved and has another provider.  Dr. Yvette Rack Driaper MD at [facility]
